# Patient Record
Sex: Female | Born: 1980 | Race: White | Hispanic: No | Marital: Single | Smoking: Never smoker
Health system: Southern US, Community
[De-identification: ages and names within clinical notes are randomized; demographics above are authoritative.]

## PROBLEM LIST (undated history)

## (undated) DIAGNOSIS — J45909 Unspecified asthma, uncomplicated: Secondary | ICD-10-CM

## (undated) DIAGNOSIS — N83209 Unspecified ovarian cyst, unspecified side: Secondary | ICD-10-CM

## (undated) DIAGNOSIS — F419 Anxiety disorder, unspecified: Secondary | ICD-10-CM

## (undated) HISTORY — PX: BREAST LUMPECTOMY: SHX2

## (undated) HISTORY — DX: Anxiety disorder, unspecified: F41.9

## (undated) HISTORY — DX: Unspecified asthma, uncomplicated: J45.909

## (undated) HISTORY — DX: Unspecified ovarian cyst, unspecified side: N83.209

---

## 2020-10-19 DIAGNOSIS — F411 Generalized anxiety disorder: Secondary | ICD-10-CM | POA: Insufficient documentation

## 2020-10-19 DIAGNOSIS — F418 Other specified anxiety disorders: Secondary | ICD-10-CM | POA: Insufficient documentation

## 2020-12-21 ENCOUNTER — Encounter: Payer: Self-pay | Admitting: Internal Medicine

## 2020-12-21 ENCOUNTER — Ambulatory Visit: Payer: 59 | Admitting: Internal Medicine

## 2020-12-21 ENCOUNTER — Other Ambulatory Visit: Payer: Self-pay

## 2020-12-21 VITALS — BP 106/72 | HR 104 | Temp 98.0°F | Ht 63.0 in | Wt 156.0 lb

## 2020-12-21 DIAGNOSIS — F411 Generalized anxiety disorder: Secondary | ICD-10-CM

## 2020-12-21 DIAGNOSIS — G43009 Migraine without aura, not intractable, without status migrainosus: Secondary | ICD-10-CM | POA: Diagnosis not present

## 2020-12-21 DIAGNOSIS — R1011 Right upper quadrant pain: Secondary | ICD-10-CM | POA: Diagnosis not present

## 2020-12-21 DIAGNOSIS — S46912A Strain of unspecified muscle, fascia and tendon at shoulder and upper arm level, left arm, initial encounter: Secondary | ICD-10-CM | POA: Diagnosis not present

## 2020-12-21 DIAGNOSIS — S56912A Strain of unspecified muscles, fascia and tendons at forearm level, left arm, initial encounter: Secondary | ICD-10-CM

## 2020-12-21 NOTE — Progress Notes (Signed)
Date:  12/21/2020   Name:  Emily Gordon   DOB:  1981-07-16   MRN:  [id number]   Chief Complaint: Establish Care, Arm Pain (X1 weeks, Left arm, pt fell, did not make contact with the ground, broke her fall with arms), and Abdominal Pain (X1 year, Right side, pain comes and goes, pt not in pain today )  Arm Pain  The incident occurred 5 to 7 days ago. The incident occurred at home. The injury mechanism was a fall. The pain is mild.  Abdominal Pain This is a recurrent problem. The current episode started more than 1 year ago. The problem occurs intermittently (several times per month). The most recent episode lasted 2 days. The problem has been unchanged. The pain is located in the RUQ and generalized abdominal region. The abdominal pain radiates to the RUQ and back. Associated symptoms include arthralgias (left elbow). Pertinent negatives include no constipation, diarrhea, fever, flatus, headaches (no migraine in at least 3 yrs), nausea or vomiting. She has tried nothing for the symptoms. Prior workup: hx of gastroparesis dx'd 5 yrs ago in [place].    No results found for: CREATININE, BUN, NA, K, CL, CO2 No results found for: CHOL, HDL, LDLCALC, LDLDIRECT, TRIG, CHOLHDL No results found for: TSH No results found for: HGBA1C No results found for: WBC, HGB, HCT, MCV, PLT No results found for: ALT, AST, GGT, ALKPHOS, BILITOT   Review of Systems  Constitutional: Positive for fatigue. Negative for chills and fever.  Respiratory: Negative for cough, chest tightness, shortness of breath and wheezing.   Gastrointestinal: Positive for abdominal pain. Negative for blood in stool, constipation, diarrhea, flatus, nausea and vomiting.  Genitourinary: Negative for menstrual problem.  Musculoskeletal: Positive for arthralgias (left elbow).  Neurological: Negative for dizziness and headaches (no migraine in at least 3 yrs).  Psychiatric/Behavioral: Positive for sleep disturbance. Negative for dysphoric  mood. The patient is not nervous/anxious.     Patient Active Problem List   Diagnosis Date Noted  . Generalized anxiety disorder 10/19/2020    Not on File  Past Surgical History:  Procedure Laterality Date  . BREAST LUMPECTOMY Right     Social History   Tobacco Use  . Smoking status: Never Smoker  . Smokeless tobacco: Never Used  Vaping Use  . Vaping Use: Never used  Substance Use Topics  . Alcohol use: Yes    Comment: occassionally  . Drug use: Never     Medication list has been reviewed and updated.  No outpatient medications have been marked as taking for the 12/21/20 encounter (Office Visit) with Reubin Milan, MD.    [facility] 2/9 Scores 12/21/2020  PHQ - 2 Score 0  PHQ- 9 Score 14    GAD 7 : Generalized Anxiety Score 12/21/2020  Nervous, Anxious, on Edge 1  Control/stop worrying 0  Worry too much - different things 0  Trouble relaxing 0  Restless 2  Easily annoyed or irritable 2  Afraid - awful might happen 0  Total GAD 7 Score 5    BP Readings from Last 3 Encounters:  12/21/20 106/72    Physical Exam Vitals and nursing note reviewed.  Constitutional:      General: She is not in acute distress.    Appearance: She is well-developed.  HENT:     Head: Normocephalic and atraumatic.  Neck:     Vascular: No carotid bruit.  Cardiovascular:     Rate and Rhythm: Normal rate and regular rhythm.  Pulses: Normal pulses.     Heart sounds: No murmur heard.   Pulmonary:     Effort: Pulmonary effort is normal. No respiratory distress.     Breath sounds: No wheezing or rhonchi.  Abdominal:     General: Abdomen is flat. Bowel sounds are normal.     Palpations: Abdomen is soft.     Tenderness: There is no abdominal tenderness. There is no right CVA tenderness, left CVA tenderness or guarding. Negative signs include Murphy's sign.  Musculoskeletal:     Left elbow: No swelling, deformity or effusion. Decreased range of motion. Tenderness present.      Cervical back: Normal range of motion.  Lymphadenopathy:     Cervical: No cervical adenopathy.  Skin:    General: Skin is warm and dry.     Findings: No rash.  Neurological:     Mental Status: She is alert and oriented to person, place, and time.  Psychiatric:        Mood and Affect: Mood normal.        Behavior: Behavior normal.     Wt Readings from Last 3 Encounters:  12/21/20 156 lb (70.8 kg)    BP 106/72   Pulse (!) 104   Temp 98 F (36.7 C) (Oral)   Ht 5\' 3"  (1.6 m)   Wt 156 lb (70.8 kg)   LMP 12/07/2020   SpO2 97%   BMI 27.63 kg/m   Assessment and Plan: 1. RUQ abdominal pain Concern for intermittent gall bladder associated sx Will get labs and 02/06/2021 - CBC with Differential/Platelet - Comprehensive metabolic panel - US Abdomen Complete; Future  2. Generalized anxiety disorder Pt feels that she is doing well currently Has tried Paxil in the recent past but did not like side effects - does not want medication at this time - TSH  3. Migraine without aura and without status migrainosus, not intractable None recently  4. Elbow strain, left, initial encounter Continue conservative therapy; avoid heavy lifting Advil or tylenol as needed  Schedule CPX with Pap. Partially dictated using [place]. Any errors are unintentional.  Animal nutritionist, MD [facility]  12/21/2020

## 2020-12-22 ENCOUNTER — Ambulatory Visit
Admission: EM | Admit: 2020-12-22 | Discharge: 2020-12-22 | Disposition: A | Discharge status: Home / Self Care | Payer: 59

## 2020-12-22 DIAGNOSIS — S99922A Unspecified injury of left foot, initial encounter: Secondary | ICD-10-CM

## 2020-12-22 LAB — CBC WITH DIFFERENTIAL/PLATELET
Basophils Absolute: 0.1 10*3/uL (ref 0.0–0.2)
Basos: 1 %
EOS (ABSOLUTE): 0 10*3/uL (ref 0.0–0.4)
Eos: 0 %
Hematocrit: 41 % (ref 34.0–46.6)
Hemoglobin: 13.8 g/dL (ref 11.1–15.9)
Immature Grans (Abs): 0 10*3/uL (ref 0.0–0.1)
Immature Granulocytes: 0 %
Lymphocytes Absolute: 1.9 10*3/uL (ref 0.7–3.1)
Lymphs: 25 %
MCH: 29.7 pg (ref 26.6–33.0)
MCHC: 33.7 g/dL (ref 31.5–35.7)
MCV: 88 fL (ref 79–97)
Monocytes Absolute: 0.6 10*3/uL (ref 0.1–0.9)
Monocytes: 7 %
Neutrophils Absolute: 5.1 10*3/uL (ref 1.4–7.0)
Neutrophils: 67 %
Platelets: 362 10*3/uL (ref 150–450)
RBC: 4.65 x10E6/uL (ref 3.77–5.28)
RDW: 12.6 % (ref 11.7–15.4)
WBC: 7.8 10*3/uL (ref 3.4–10.8)

## 2020-12-22 LAB — COMPREHENSIVE METABOLIC PANEL
ALT: 25 IU/L (ref 0–32)
AST: 26 IU/L (ref 0–40)
Albumin/Globulin Ratio: 1.9 (ref 1.2–2.2)
Albumin: 4.7 g/dL (ref 3.8–4.8)
Alkaline Phosphatase: 98 IU/L (ref 44–121)
BUN/Creatinine Ratio: 13 (ref 9–23)
BUN: 12 mg/dL (ref 6–20)
Bilirubin Total: 0.2 mg/dL (ref 0.0–1.2)
CO2: 22 mmol/L (ref 20–29)
Calcium: 9.9 mg/dL (ref 8.7–10.2)
Chloride: 99 mmol/L (ref 96–106)
Creatinine, Ser: 0.94 mg/dL (ref 0.57–1.00)
Globulin, Total: 2.5 g/dL (ref 1.5–4.5)
Glucose: 107 mg/dL — ABNORMAL HIGH (ref 65–99)
Potassium: 4.2 mmol/L (ref 3.5–5.2)
Sodium: 138 mmol/L (ref 134–144)
Total Protein: 7.2 g/dL (ref 6.0–8.5)
eGFR: 79 mL/min/{1.73_m2} (ref >59)

## 2020-12-22 LAB — TSH: TSH: 1.74 u[IU]/mL (ref 0.450–4.500)

## 2020-12-22 NOTE — Discharge Instructions (Addendum)
Keep the area covered with a Band-Aid as the new nail grows in.  Clip the sharp edge with the peritoneal clippers when at home so it does not catch on any more clothing or shoes.  Return for reevaluation for any new or worsening injury.

## 2020-12-22 NOTE — ED Triage Notes (Signed)
Pt c/o toenail problem. She hit her toe with a free weight and it broke the tip of her left great toe nail 2 days ago. She reports today she rubbed it in some way and it started to lift off the broken part of the nail. She has applied ambusol to the nail bed in an attempt to remove the broken part of the nail. She was not successful. She is asking to have the toe numbed and the nail removed.

## 2020-12-22 NOTE — ED Provider Notes (Signed)
[facility]    CSN: [id number] Arrival date & time: 12/22/20  1914      History   Chief Complaint Chief Complaint  Patient presents with  . toe nail injury    Left great    HPI Emily Gordon is a 40 y.o. female.   HPI   40 year old female here for evaluation of injury to her left big toenail.  Patient reports that 2 days ago she kicked a free weight and broke the tip of her left great toenail.  Today she reports that the broken piece, which was not secured with Band-Aid, caught on the back of her pants and tore some more.  She attempted to numb her toe up using Anbesol and remove the nail herself but states that it was too painful for her to attempt manipulation.  Past Medical History:  Diagnosis Date  . Anxiety   . Asthma   . Ovarian cyst     Patient Active Problem List   Diagnosis Date Noted  . Generalized anxiety disorder 10/19/2020    Past Surgical History:  Procedure Laterality Date  . BREAST LUMPECTOMY Right     OB History   No obstetric history on file.      Home Medications    Prior to Admission medications   Not on File    Family History Family History  Problem Relation Age of Onset  . Cancer Maternal Grandmother   . Cancer Maternal Grandfather     Social History Social History   Tobacco Use  . Smoking status: Never Smoker  . Smokeless tobacco: Never Used  Vaping Use  . Vaping Use: Never used  Substance Use Topics  . Alcohol use: Yes    Comment: occassionally  . Drug use: Never     Allergies   Patient has no known allergies.   Review of Systems Review of Systems  Constitutional: Negative for activity change, appetite change and fever.  Skin: Positive for wound. Negative for color change.  Neurological: Negative for weakness and numbness.  Hematological: Negative.   Psychiatric/Behavioral: Negative.      Physical Exam Triage Vital Signs ED Triage Vitals  Enc Vitals Group     BP 12/22/20 1946 (!) 143/87      Pulse Rate 12/22/20 1946 (!) 106     Resp 12/22/20 1946 18     Temp 12/22/20 1946 98.4 F (36.9 C)     Temp Source 12/22/20 1946 Oral     SpO2 12/22/20 1946 99 %     Weight 12/22/20 1945 155 lb (70.3 kg)     Height 12/22/20 1945 5\' 3"  (1.6 m)     Head Circumference --      Peak Flow --      Pain Score 12/22/20 1944 10     Pain Loc --      Pain Edu? --      Excl. in GC? --    No data found.  Updated Vital Signs BP (!) 143/87 (BP Location: Left Arm)   Pulse (!) 106   Temp 98.4 F (36.9 C) (Oral)   Resp 18   Ht 5\' 3"  (1.6 m)   Wt 155 lb (70.3 kg)   LMP 12/07/2020   SpO2 99%   BMI 27.46 kg/m   Visual Acuity Right Eye Distance:   Left Eye Distance:   Bilateral Distance:    Right Eye Near:   Left Eye Near:    Bilateral Near:     Physical Exam  Vitals and nursing note reviewed.  Constitutional:      General: She is not in acute distress.    Appearance: Normal appearance. She is normal weight. She is not ill-appearing.  HENT:     Head: Normocephalic and atraumatic.  Musculoskeletal:        General: Tenderness and signs of injury present. No swelling or deformity. Normal range of motion.  Skin:    General: Skin is warm and dry.     Capillary Refill: Capillary refill takes less than 2 seconds.     Findings: No bruising, erythema or rash.  Neurological:     General: No focal deficit present.     Mental Status: She is alert and oriented to person, place, and time.  Psychiatric:        Mood and Affect: Mood normal.        Behavior: Behavior normal.        Thought Content: Thought content normal.        Judgment: Judgment normal.      UC Treatments / Results  Labs (all labs ordered are listed, but only abnormal results are displayed) Labs Reviewed - No data to display  EKG   Radiology No results found.  Procedures Procedures (including critical care time)  Medications Ordered in UC Medications - No data to display  Initial Impression / Assessment  and Plan / UC Course  I have reviewed the triage vital signs and the nursing notes.  Pertinent labs & imaging results that were available during my care of the patient were reviewed by me and considered in my medical decision making (see chart for details).   Patient is a very pleasant 40 year old female here for evaluation of a hangnail on her left great toenail.  She reports that she initially broke the nail by kicking a free weight yesterday and then got the broken piece hooked on her pant leg today which tore it but did not tear completely.  Patient tried numbing up the toenail herself by applying Anbesol to remove the piece of nail but was unsuccessful due to the pain.  Patient has no tenderness to the IP or MTP joint of the left great toe and there is no erythema, edema, or ecchymosis to the tip of her toe.  Patient initially requesting to be anesthetized for the removal of the torn piece of toenail but reconsidered after was explained that she would have to get a needle in her toe in order to achieve that.  Piece of toenail was easily removed manually with a piece of gauze.  No active bleeding from the site.  Patient does have a sharp edged remaining to the deeper toenail that I encouraged her to clip when she gets home with a pair of clippers.  Band-Aid applied and patient left by ambulation in no acute distress.   Final Clinical Impressions(s) / UC Diagnoses   Final diagnoses:  Injury of toenail of left foot, initial encounter     Discharge Instructions     Keep the area covered with a Band-Aid as the new nail grows in.  Clip the sharp edge with the peritoneal clippers when at home so it does not catch on any more clothing or shoes.  Return for reevaluation for any new or worsening injury.    ED Prescriptions    None     PDMP not reviewed this encounter.   Becky Augusta, NP 12/22/20 2018

## 2021-02-08 ENCOUNTER — Other Ambulatory Visit: Payer: Self-pay

## 2021-02-08 ENCOUNTER — Ambulatory Visit
Admission: RE | Admit: 2021-02-08 | Discharge: 2021-02-08 | Disposition: A | Discharge status: Home / Self Care | Payer: BC Managed Care – PPO | Source: Ambulatory Visit | Attending: Internal Medicine | Admitting: Internal Medicine

## 2021-02-08 DIAGNOSIS — R1011 Right upper quadrant pain: Secondary | ICD-10-CM | POA: Insufficient documentation

## 2021-03-22 ENCOUNTER — Emergency Department: Payer: BC Managed Care – PPO

## 2021-03-22 ENCOUNTER — Emergency Department
Admission: EM | Admit: 2021-03-22 | Discharge: 2021-03-22 | Disposition: A | Discharge status: Home / Self Care | Payer: BC Managed Care – PPO | Attending: Emergency Medicine | Admitting: Emergency Medicine

## 2021-03-22 ENCOUNTER — Other Ambulatory Visit: Payer: Self-pay

## 2021-03-22 DIAGNOSIS — N939 Abnormal uterine and vaginal bleeding, unspecified: Secondary | ICD-10-CM | POA: Diagnosis present

## 2021-03-22 DIAGNOSIS — J45909 Unspecified asthma, uncomplicated: Secondary | ICD-10-CM | POA: Diagnosis not present

## 2021-03-22 DIAGNOSIS — R102 Pelvic and perineal pain: Secondary | ICD-10-CM | POA: Insufficient documentation

## 2021-03-22 LAB — URINALYSIS, COMPLETE (UACMP) WITH MICROSCOPIC
Bacteria, UA: NONE SEEN
Bilirubin Urine: NEGATIVE
Glucose, UA: NEGATIVE mg/dL
Ketones, ur: NEGATIVE mg/dL
Leukocytes,Ua: NEGATIVE
Nitrite: NEGATIVE
Protein, ur: NEGATIVE mg/dL
Specific Gravity, Urine: 1.005 (ref 1.005–1.030)
WBC, UA: NONE SEEN WBC/hpf (ref 0–5)
pH: 7 (ref 5.0–8.0)

## 2021-03-22 LAB — CBC
HCT: 40 % (ref 36.0–46.0)
Hemoglobin: 13.7 g/dL (ref 12.0–15.0)
MCH: 30 pg (ref 26.0–34.0)
MCHC: 34.3 g/dL (ref 30.0–36.0)
MCV: 87.5 fL (ref 80.0–100.0)
Platelets: 345 10*3/uL (ref 150–400)
RBC: 4.57 MIL/uL (ref 3.87–5.11)
RDW: 13.1 % (ref 11.5–15.5)
WBC: 10.6 10*3/uL — ABNORMAL HIGH (ref 4.0–10.5)
nRBC: 0 % (ref 0.0–0.2)

## 2021-03-22 LAB — CHLAMYDIA/NGC RT PCR (ARMC ONLY)
Chlamydia Tr: NOT DETECTED
N gonorrhoeae: NOT DETECTED

## 2021-03-22 LAB — WET PREP, GENITAL
Clue Cells Wet Prep HPF POC: NONE SEEN
Sperm: NONE SEEN
Trich, Wet Prep: NONE SEEN
Yeast Wet Prep HPF POC: NONE SEEN

## 2021-03-22 LAB — POC URINE PREG, ED: Preg Test, Ur: NEGATIVE

## 2021-03-22 NOTE — ED Provider Notes (Signed)
[facility] Emergency Department Provider Note ____________________________________________  Time seen: 1648  I have reviewed the triage vital signs and the nursing notes.  HISTORY  Chief Complaint  Vaginal Bleeding   HPI Emily Gordon is a 40 y.o. female presents to the ED for intermittent vaginal bleeding, after sexual encounter. She reports pelvic fullness and dark to bright red blood. She notes flow less than a menstrual period. She denies vaginal discharge, dysuria, or flank pain. She notes regular menses every month.   Past Medical History:  Diagnosis Date   Anxiety    Asthma    Ovarian cyst     Patient Active Problem List   Diagnosis Date Noted   Generalized anxiety disorder 10/19/2020    Past Surgical History:  Procedure Laterality Date   BREAST LUMPECTOMY Right     Prior to Admission medications   Not on File    Allergies Patient has no known allergies.  Family History  Problem Relation Age of Onset   Cancer Maternal Grandmother    Cancer Maternal Grandfather     Social History Social History   Tobacco Use   Smoking status: Never   Smokeless tobacco: Never  Vaping Use   Vaping Use: Never used  Substance Use Topics   Alcohol use: Yes    Comment: occassionally   Drug use: Never    Review of Systems  Constitutional: Negative for fever. Eyes: Negative for visual changes. ENT: Negative for sore throat. Cardiovascular: Negative for chest pain. Respiratory: Negative for shortness of breath. Gastrointestinal: Negative for abdominal pain, vomiting and diarrhea. Genitourinary: Negative for dysuria.abnormal vaginal bleeding. Pelvic pain Musculoskeletal: Negative for back pain. Skin: Negative for rash. Neurological: Negative for headaches, focal weakness or numbness. ____________________________________________  PHYSICAL EXAM:  VITAL SIGNS: ED Triage Vitals  Enc Vitals Group     BP 03/22/21 1547 135/66     Pulse Rate  03/22/21 1547 100     Resp 03/22/21 1547 18     Temp 03/22/21 1547 98 F (36.7 C)     Temp src --      SpO2 03/22/21 1547 100 %     Weight --      Height --      Head Circumference --      Peak Flow --      Pain Score 03/22/21 1544 6     Pain Loc --      Pain Edu? --      Excl. in GC? --     Constitutional: Alert and oriented. Well appearing and in no distress. Head: Normocephalic and atraumatic. Eyes: Conjunctivae are normal. Normal extraocular movements Cardiovascular: Normal rate, regular rhythm. Normal distal pulses. Respiratory: Normal respiratory effort.  Gastrointestinal: Soft and nontender. No distention. GU: Normal external genitalia.  Bright red blood in the vaginal vault.  Cervical os is closed with no CMT noted no adnexal masses appreciated. Musculoskeletal: Nontender with normal range of motion in all extremities.  Neurologic:  Normal gait without ataxia. Normal speech and language. No gross focal neurologic deficits are appreciated. Skin:  Skin is warm, dry and intact. No rash noted. Psychiatric: Mood and affect are normal. Patient exhibits appropriate insight and judgment. ____________________________________________   LABS (pertinent positives/negatives)  Labs Reviewed  WET PREP, GENITAL - Abnormal; Notable for the following components:      Result Value   WBC, Wet Prep HPF POC FEW (*)    All other components within normal limits  CBC - Abnormal; Notable for the  following components:   WBC 10.6 (*)    All other components within normal limits  URINALYSIS, COMPLETE (UACMP) WITH MICROSCOPIC - Abnormal; Notable for the following components:   Color, Urine STRAW (*)    APPearance CLEAR (*)    Hgb urine dipstick MODERATE (*)    All other components within normal limits  CHLAMYDIA/NGC RT PCR ([facility] ONLY)            FSH/LH  POC URINE PREG, ED  ____________________________________________   RADIOLOGY  Official radiology report(s): US PELVIC COMPLETE W  TRANSVAGINAL AND TORSION R/O  Result Date: 03/22/2021 CLINICAL DATA:  Post coital vaginal bleeding EXAM: TRANSABDOMINAL AND TRANSVAGINAL ULTRASOUND OF PELVIS DOPPLER ULTRASOUND OF OVARIES TECHNIQUE: Both transabdominal and transvaginal ultrasound examinations of the pelvis were performed. Transabdominal technique was performed for global imaging of the pelvis including uterus, ovaries, adnexal regions, and pelvic cul-de-sac. It was necessary to proceed with endovaginal exam following the transabdominal exam to visualize the endometrium and adnexal structures. Color and duplex Doppler ultrasound was utilized to evaluate blood flow to the ovaries. COMPARISON:  None FINDINGS: Uterus Measurements: 7.3 x 3.1 x 4.3 cm = volume: 50.4 mL. No fibroids or other mass visualized. Endometrium Thickness: 5 mm.  No focal abnormality visualized. Right ovary Measurements: 2.0 x 2.9 x 1.6 cm = volume: 4.5 mL. Normal appearance/no adnexal mass. Left ovary Measurements: 2.5 x 1.1 x 2.1 cm = volume: 3.1 mL. Normal appearance/no adnexal mass. Pulsed Doppler evaluation of both ovaries demonstrates normal low-resistance arterial and venous waveforms. Other findings No abnormal free fluid. IMPRESSION: 1. Unremarkable age-appropriate pelvic ultrasound. Electronically Signed   By: Sharlet Salina M.D.   On: 03/22/2021 19:12   ____________________________________________  PROCEDURES   Procedures ____________________________________________   INITIAL IMPRESSION / ASSESSMENT AND PLAN / ED COURSE  As part of my medical decision making, I reviewed the following data within the electronic MEDICAL RECORD NUMBER Labs reviewed as above, Radiograph reviewed WNL, and Notes from prior ED visits    Differential diagnosis includes, but is not limited to, threatened miscarriage, incomplete miscarriage, normal bleeding from menses, menorrhagia/metrorrhagia, ectopic pregnancy, benign bleeding of pregnancy, vaginal/cervical trauma, ovarian cyst,  endometriosis, etc.  Female with with ED evaluation of postcoital bleeding, for the last week.  Patient presents for evaluation of complaints.  Labs are reassuring as it shows no anemia or acute infection.  Vaginal swabs also negative for any acute infectious process.  Ultrasound does not reveal any ovarian cysts, endometriosis, uterine fibroids.  Patient exam is overall benign and stable at this time patient will follow with primary provider for ongoing management.  We discussed options for management of vaginal bleeding including NSAIDs, oral contraceptives, and progesterone.  Patient is declined any medical history at this time.  She will follow-up as discussed, return to the ED if necessary.    Emily Gordon was evaluated in Emergency Department on 03/22/2021 for the symptoms described in the history of present illness. She was evaluated in the context of the global COVID-19 pandemic, which necessitated consideration that the patient might be at risk for infection with the SARS-CoV-2 virus that causes COVID-19. Institutional protocols and algorithms that pertain to the evaluation of patients at risk for COVID-19 are in a state of rapid change based on information released by regulatory bodies including the CDC and federal and state organizations. These policies and algorithms were followed during the patient's care in the ED. ____________________________________________  FINAL CLINICAL IMPRESSION(S) / ED DIAGNOSES  Final diagnoses:  Abnormal vaginal bleeding  Pelvic pain      Karmen Stabs, Charlesetta Ivory, PA-C 03/22/21 1946    Arnaldo Natal, MD 03/23/21 [id number]

## 2021-03-22 NOTE — ED Notes (Signed)
See triage note, pt reports vaginal bleeding and lower abd pain that started after intercourse about a week ago. States bleeding had been intermittent but started more after wiping yesterday.  Hx ovarian cysts but states this feels different.  Ambulatory, NAD noted

## 2021-03-22 NOTE — ED Triage Notes (Addendum)
Pt comes with c/o week long vaginal bleeding. Pt states this started after intercourse and let up. Pt states it now started again.  Pt states unsure if pregnant and fullness to belly area.

## 2021-03-22 NOTE — Discharge Instructions (Addendum)
Your exam, labs, and ultrasound are reassuring at this time.  No signs of infection, UTI, or ovarian cyst. Consider taking ibuprofen for pain and bleeding relief.  You should follow-up with your primary provider for ongoing symptoms.  Return to the ED if needed.

## 2021-03-25 ENCOUNTER — Encounter: Payer: Self-pay | Admitting: Internal Medicine

## 2021-03-25 ENCOUNTER — Ambulatory Visit
Admission: RE | Admit: 2021-03-25 | Discharge: 2021-03-25 | Disposition: A | Discharge status: Home / Self Care | Payer: BC Managed Care – PPO | Source: Ambulatory Visit | Attending: Internal Medicine | Admitting: Internal Medicine

## 2021-03-25 ENCOUNTER — Other Ambulatory Visit: Payer: Self-pay

## 2021-03-25 ENCOUNTER — Ambulatory Visit
Admission: RE | Admit: 2021-03-25 | Discharge: 2021-03-25 | Disposition: A | Discharge status: Home / Self Care | Payer: BC Managed Care – PPO | Attending: Internal Medicine | Admitting: Internal Medicine

## 2021-03-25 ENCOUNTER — Ambulatory Visit (INDEPENDENT_AMBULATORY_CARE_PROVIDER_SITE_OTHER): Payer: BC Managed Care – PPO | Admitting: Internal Medicine

## 2021-03-25 VITALS — BP 128/74 | HR 78 | Temp 98.4°F | Ht 63.0 in | Wt 154.0 lb

## 2021-03-25 DIAGNOSIS — M25522 Pain in left elbow: Secondary | ICD-10-CM

## 2021-03-25 DIAGNOSIS — G8929 Other chronic pain: Secondary | ICD-10-CM | POA: Insufficient documentation

## 2021-03-25 DIAGNOSIS — N938 Other specified abnormal uterine and vaginal bleeding: Secondary | ICD-10-CM

## 2021-03-25 NOTE — Progress Notes (Signed)
Date:  03/25/2021   Name:  Emily Gordon   DOB:  08-Feb-1981   MRN:  [id number]   Chief Complaint: Arm Pain (X3.5 months, Left arm. Lump on arm, hurts when picking dogs up  ) and Vaginal Bleeding (Pain and bleeding after intercourse )  Arm Pain  The incident occurred more than 1 week ago. The incident occurred at home. The injury mechanism was a fall. The pain is present in the left elbow. The quality of the pain is described as aching. The pain does not radiate. The pain is mild. The pain has been Fluctuating since the incident. Pertinent negatives include no chest pain or numbness. Exacerbated by: supination and lifting. She has tried rest for the symptoms. The treatment provided no relief.  Vaginal Bleeding This is a new problem. The problem has been gradually improving. Associated symptoms include abdominal pain (mild cramping). Pertinent negatives include no chills or fever.  She has onset of bleeding after intercourse that was moderate but then decreased, followed by cramping and heavier flow that was early for her cycle.  However, cycle is irregular.  She was seen in ED - CBC, pregnancy test, STD screens were negative.  She is still having some bleeding but it is lessening as it would for a regular cycle. US - unremarkable  Lab Results  Component Value Date   CREATININE 0.94 12/21/2020   BUN 12 12/21/2020   NA 138 12/21/2020   K 4.2 12/21/2020   CL 99 12/21/2020   CO2 22 12/21/2020   No results found for: CHOL, HDL, LDLCALC, LDLDIRECT, TRIG, CHOLHDL Lab Results  Component Value Date   TSH 1.740 12/21/2020   No results found for: HGBA1C Lab Results  Component Value Date   WBC 10.6 (H) 03/22/2021   HGB 13.7 03/22/2021   HCT 40.0 03/22/2021   MCV 87.5 03/22/2021   PLT 345 03/22/2021   Lab Results  Component Value Date   ALT 25 12/21/2020   AST 26 12/21/2020   ALKPHOS 98 12/21/2020   BILITOT 0.2 12/21/2020     Review of Systems  Constitutional:  Negative for  chills, fatigue and fever.  Respiratory:  Negative for chest tightness and shortness of breath.   Cardiovascular:  Negative for chest pain.  Gastrointestinal:  Positive for abdominal pain (mild cramping).  Genitourinary:  Positive for vaginal bleeding.  Musculoskeletal:  Positive for arthralgias (left elbow pain and fullness in the antecubital area).  Neurological:  Negative for weakness and numbness.   Patient Active Problem List   Diagnosis Date Noted   Generalized anxiety disorder 10/19/2020    No Known Allergies  Past Surgical History:  Procedure Laterality Date   BREAST LUMPECTOMY Right     Social History   Tobacco Use   Smoking status: Never   Smokeless tobacco: Never  Vaping Use   Vaping Use: Never used  Substance Use Topics   Alcohol use: Yes    Comment: occassionally   Drug use: Never     Medication list has been reviewed and updated.  Current Meds  Medication Sig   ibuprofen (ADVIL) 600 MG tablet Take 600 mg by mouth every 6 (six) hours as needed.    PHQ 2/9 Scores 03/25/2021 12/21/2020  PHQ - 2 Score 3 0  PHQ- 9 Score 17 14    GAD 7 : Generalized Anxiety Score 03/25/2021 12/21/2020  Nervous, Anxious, on Edge 1 1  Control/stop worrying 1 0  Worry too much - different things 1 0  Trouble relaxing 1 0  Restless 2 2  Easily annoyed or irritable 2 2  Afraid - awful might happen 0 0  Total GAD 7 Score 8 5    BP Readings from Last 3 Encounters:  03/25/21 128/74  03/22/21 123/70  12/22/20 (!) 143/87    Physical Exam Vitals and nursing note reviewed.  Constitutional:      General: She is not in acute distress.    Appearance: Normal appearance. She is well-developed.  HENT:     Head: Normocephalic and atraumatic.  Cardiovascular:     Rate and Rhythm: Normal rate and regular rhythm.     Pulses: Normal pulses.     Heart sounds: No murmur heard. Pulmonary:     Effort: Pulmonary effort is normal. No respiratory distress.     Breath sounds: No  wheezing or rhonchi.  Musculoskeletal:     Left elbow: Swelling present. Decreased range of motion. Tenderness present.     Right lower leg: No edema.     Left lower leg: No edema.  Skin:    General: Skin is warm and dry.     Findings: No rash.  Neurological:     Mental Status: She is alert and oriented to person, place, and time.  Psychiatric:        Mood and Affect: Mood normal.        Behavior: Behavior normal.    Wt Readings from Last 3 Encounters:  03/25/21 154 lb (69.9 kg)  12/22/20 155 lb (70.3 kg)  12/21/20 156 lb (70.8 kg)    BP 128/74   Pulse 78   Temp 98.4 F (36.9 C) (Oral)   Ht 5\' 3"  (1.6 m)   Wt 154 lb (69.9 kg)   LMP 03/23/2021   SpO2 98%   BMI 27.28 kg/m   Assessment and Plan: 1. Chronic elbow pain, left Following a fall; not improving with activity modification Will get plain films and refer to Dr. 03/25/2021 - DG Elbow Complete Left; Future  2. Dysfunctional uterine bleeding Likely due to irregular menses Labs and Ashley Royalty were reassuring Rec establish care and follow up with GYN - Ambulatory referral to Obstetrics / Gynecology   Partially dictated using Dragon software. Any errors are unintentional.  [place], MD [facility]  03/25/2021

## 2021-04-05 ENCOUNTER — Ambulatory Visit (INDEPENDENT_AMBULATORY_CARE_PROVIDER_SITE_OTHER): Payer: BC Managed Care – PPO | Admitting: Family Medicine

## 2021-04-05 ENCOUNTER — Other Ambulatory Visit: Payer: Self-pay

## 2021-04-05 ENCOUNTER — Encounter: Payer: Self-pay | Admitting: Family Medicine

## 2021-04-05 VITALS — BP 116/72 | HR 114 | Temp 98.5°F | Ht 63.0 in | Wt 152.0 lb

## 2021-04-05 DIAGNOSIS — M7712 Lateral epicondylitis, left elbow: Secondary | ICD-10-CM | POA: Diagnosis not present

## 2021-04-05 MED ORDER — MELOXICAM 15 MG PO TABS: 15.0000 mg | ORAL_TABLET | Freq: Every day | ORAL | 0 refills | Status: DC

## 2021-04-05 NOTE — Progress Notes (Signed)
Primary Care / Sports Medicine Office Visit  Patient Information:  Patient ID: Emily Gordon, female DOB: December 20, 1980 Age: 40 y.o. MRN: [id number]   Emily Gordon is a pleasant 40 y.o. female presenting with the following:  Chief Complaint  Patient presents with   New Patient (Initial Visit)   Elbow Pain    Left; x3 months; no known injury, but history of falling 3 times this past year; X-Ray 03/25/21; located near lateral antecubital space; has tried ibuprofen and heat with no relief; intermittent; right-handedness; 6/10 pain    Review of Systems pertinent details above   Patient Active Problem List   Diagnosis Date Noted   Lateral epicondylitis of left elbow 04/05/2021   Generalized anxiety disorder 10/19/2020   Past Medical History:  Diagnosis Date   Anxiety    Asthma    Ovarian cyst    Outpatient Encounter Medications as of 04/05/2021  Medication Sig   meloxicam (MOBIC) 15 MG tablet Take 1 tablet (15 mg total) by mouth daily.   [DISCONTINUED] ibuprofen (ADVIL) 600 MG tablet Take 600 mg by mouth every 6 (six) hours as needed.   No facility-administered encounter medications on file as of 04/05/2021.   Past Surgical History:  Procedure Laterality Date   BREAST LUMPECTOMY Right     Vitals:   04/05/21 1552  BP: 116/72  Pulse: (!) 114  Temp: 98.5 F (36.9 C)  SpO2: 98%   Vitals:   04/05/21 1552  Weight: 152 lb (68.9 kg)  Height: 5\' 3"  (1.6 m)   Body mass index is 26.93 kg/m.  DG Elbow Complete Left  Result Date: 03/26/2021 CLINICAL DATA:  Chronic left elbow pain EXAM: LEFT ELBOW - COMPLETE 3+ VIEW COMPARISON:  None. FINDINGS: There is no evidence of fracture, dislocation, or joint effusion. There is no evidence of arthropathy or other focal bone abnormality. Soft tissues are unremarkable. IMPRESSION: No acute osseous injury of the left elbow. Electronically Signed   By: 03/28/2021   On: 03/26/2021 10:54   Independent interpretation of notes and tests  performed by another provider:   Independent interpretation of left elbow x-ray dated 03/25/2021 reveals maintained articular surfaces, no cortical irregularities or roughening, no abnormal soft tissue shadow appreciated, no acute osseous process identified  Procedures performed:   None  Pertinent History, Exam, Impression, and Recommendations:   Lateral epicondylitis of left elbow Right-hand-dominant patient presenting with atraumatic left lateral elbow pain, she does endorse an 8-hour drive preceding the onset of pain, pain is focal, nonradiating, aggravated by lifting and bending the elbow, hand/wrist usage ipsilaterally.  Denies any paresthesias, minimally improved with rest and OTC NSAIDs.  Physical exam today shows focal tenderness to the extensor carpi radialis complex, provocative testing is consistent with lateral epicondylitis. Her x-rays are reassuring. We did trial counterforce brace which was beneficial, she will use this x 4 weeks, start home rehab in 1 week (exercises provided), dose meloxicam daily x2 weeks then transition to as needed, and maintain follow-up in 4 weeks.  If suboptimal progress noted, anticipate ultrasound-guided injection, can also consider formal physical therapy, escalation of oral pharmacotherapy.  If improved, maintenance strategies to be discussed and weaning/discontinuation of counterforce brace.    Orders & Medications Meds ordered this encounter  Medications   meloxicam (MOBIC) 15 MG tablet    Sig: Take 1 tablet (15 mg total) by mouth daily.    Dispense:  30 tablet    Refill:  0   No orders of the defined  types were placed in this encounter.    Return in about 4 weeks (around 05/03/2021).     Jerrol Banana, MD   Primary Care Sports Medicine [facility]

## 2021-04-05 NOTE — Assessment & Plan Note (Signed)
Right-hand-dominant patient presenting with atraumatic left lateral elbow pain, she does endorse an 8-hour drive preceding the onset of pain, pain is focal, nonradiating, aggravated by lifting and bending the elbow, hand/wrist usage ipsilaterally.  Denies any paresthesias, minimally improved with rest and OTC NSAIDs.  Physical exam today shows focal tenderness to the extensor carpi radialis complex, provocative testing is consistent with lateral epicondylitis. Her x-rays are reassuring. We did trial counterforce brace which was beneficial, she will use this x 4 weeks, start home rehab in 1 week (exercises provided), dose meloxicam daily x2 weeks then transition to as needed, and maintain follow-up in 4 weeks.  If suboptimal progress noted, anticipate ultrasound-guided injection, can also consider formal physical therapy, escalation of oral pharmacotherapy.  If improved, maintenance strategies to be discussed and weaning/discontinuation of counterforce brace.

## 2021-04-05 NOTE — Patient Instructions (Signed)
-   Start meloxicam and dose once daily with food x2 weeks, after 2 weeks dose once daily on an as-needed basis - Use elbow strap brace throughout your wakeful hours (removed for bathing, sleeping, exercises) - After 1 week, start and gradually advance home exercises (start the stretching exercises and perform the strengthening exercises once daily stretching exercises for nearly painless) - Perform activity as tolerated using symptoms as a guide - Return for follow-up in 4 weeks

## 2021-04-07 ENCOUNTER — Ambulatory Visit: Payer: BC Managed Care – PPO | Admitting: Internal Medicine

## 2021-04-07 ENCOUNTER — Other Ambulatory Visit: Payer: Self-pay

## 2021-04-07 ENCOUNTER — Encounter: Payer: Self-pay | Admitting: Internal Medicine

## 2021-04-07 VITALS — BP 136/82 | HR 92 | Ht 63.0 in | Wt 152.0 lb

## 2021-04-07 DIAGNOSIS — F411 Generalized anxiety disorder: Secondary | ICD-10-CM | POA: Diagnosis not present

## 2021-04-07 DIAGNOSIS — M542 Cervicalgia: Secondary | ICD-10-CM | POA: Diagnosis not present

## 2021-04-07 DIAGNOSIS — R42 Dizziness and giddiness: Secondary | ICD-10-CM

## 2021-04-07 DIAGNOSIS — R000 Tachycardia, unspecified: Secondary | ICD-10-CM | POA: Diagnosis not present

## 2021-04-07 DIAGNOSIS — G8929 Other chronic pain: Secondary | ICD-10-CM

## 2021-04-07 DIAGNOSIS — R5383 Other fatigue: Secondary | ICD-10-CM

## 2021-04-07 MED ORDER — CYCLOBENZAPRINE HCL 10 MG PO TABS: 10.0000 mg | ORAL_TABLET | Freq: Three times a day (TID) | ORAL | 0 refills | Status: DC | PRN

## 2021-04-07 NOTE — Progress Notes (Signed)
Date:  04/07/2021   Name:  Emily Gordon   DOB:  15-Jun-1981   MRN:  [id number]   Chief Complaint: Gait Problem (Dizzy spells with nausea on and off. Having memory loss more frequently. Gait issues. Fatigue. Muscle spasms in upper arms and eyes. This all has been happening for several years on and off.)  Dizziness This is a new problem. The current episode started 1 to 4 weeks ago. Episode frequency: 2 episodes. Associated symptoms include fatigue, nausea and vertigo. Pertinent negatives include no arthralgias, chest pain, chills, coughing, diaphoresis, fever, headaches, sore throat, vomiting or weakness. Nothing aggravates the symptoms.  Tingling in hands/arms - started about 6 years ago.  Evaluated by Neurology - she had an MRI and NCS.  Told she had nerve damage.  Tried a number of meds which were not helpful.  Never had ESI, accupuncture, medical massage.  Not sure about muscle relaxants. Memory loss - described more as decreased concentration - has to ask her boyfriend what he said minutes before.  Forgot where she parked her car at the store.  No other specific examples. Anxiety - has high anxiety - tried several meds in the past that made her feel worse - Zoloft, Paxil, Celexa, Bupropion. Fatigue - much less energy than usual.  Previously up early, keeping her house clean, etc now she is letting things go.  She is functioning at work but has to keep notes as she works. Tachycardia - hx of HR up to 220.  Seen in the past by cardiology and worked up with Holter.  No cause was found and she was treated with Cardizem.  She stopped it after some time when sx improved.  Now having a bit more increase in HR but not as severe as before.  She limits caffeine to 2 cups coffee per day, no energy drinks, diet pills, etc.  Lab Results  Component Value Date   CREATININE 0.94 12/21/2020   BUN 12 12/21/2020   NA 138 12/21/2020   K 4.2 12/21/2020   CL 99 12/21/2020   CO2 22 12/21/2020   No results  found for: CHOL, HDL, LDLCALC, LDLDIRECT, TRIG, CHOLHDL Lab Results  Component Value Date   TSH 1.740 12/21/2020   No results found for: HGBA1C Lab Results  Component Value Date   WBC 10.6 (H) 03/22/2021   HGB 13.7 03/22/2021   HCT 40.0 03/22/2021   MCV 87.5 03/22/2021   PLT 345 03/22/2021   Lab Results  Component Value Date   ALT 25 12/21/2020   AST 26 12/21/2020   ALKPHOS 98 12/21/2020   BILITOT 0.2 12/21/2020     Review of Systems  Constitutional:  Positive for fatigue. Negative for chills, diaphoresis, fever and unexpected weight change.  HENT:  Positive for tinnitus. Negative for ear pain, postnasal drip and sore throat.   Respiratory:  Negative for cough, chest tightness, shortness of breath and wheezing.   Cardiovascular:  Positive for palpitations. Negative for chest pain and leg swelling.  Gastrointestinal:  Positive for nausea. Negative for vomiting.  Genitourinary:  Pelvic pain: hair thinning.  Musculoskeletal:  Negative for arthralgias.  Neurological:  Positive for dizziness and vertigo. Negative for syncope, weakness, light-headedness and headaches.  Psychiatric/Behavioral:  Positive for dysphoric mood. Negative for sleep disturbance. The patient is nervous/anxious.    Patient Active Problem List   Diagnosis Date Noted   Lateral epicondylitis of left elbow 04/05/2021   Generalized anxiety disorder 10/19/2020    No Known Allergies  Past Surgical History:  Procedure Laterality Date   BREAST LUMPECTOMY Right     Social History   Tobacco Use   Smoking status: Never   Smokeless tobacco: Never  Vaping Use   Vaping Use: Never used  Substance Use Topics   Alcohol use: Yes    Comment: occassionally   Drug use: Never     Medication list has been reviewed and updated.  Current Meds  Medication Sig   cyclobenzaprine (FLEXERIL) 10 MG tablet Take 1 tablet (10 mg total) by mouth 3 (three) times daily as needed for muscle spasms.   meloxicam (MOBIC) 15  MG tablet Take 1 tablet (15 mg total) by mouth daily.    PHQ 2/9 Scores 04/05/2021 03/25/2021 12/21/2020  PHQ - 2 Score 4 3 0  PHQ- 9 Score 18 17 14     GAD 7 : Generalized Anxiety Score 04/05/2021 03/25/2021 12/21/2020  Nervous, Anxious, on Edge 3 1 1   Control/stop worrying 3 1 0  Worry too much - different things 3 1 0  Trouble relaxing 3 1 0  Restless 3 2 2   Easily annoyed or irritable 2 2 2   Afraid - awful might happen 0 0 0  Total GAD 7 Score 17 8 5   Anxiety Difficulty Very difficult - -    BP Readings from Last 3 Encounters:  04/07/21 136/82  04/05/21 116/72  03/25/21 128/74    Physical Exam Constitutional:      Appearance: Normal appearance.  HENT:     Right Ear: Tympanic membrane and ear canal normal.     Left Ear: Tympanic membrane and ear canal normal.  Cardiovascular:     Rate and Rhythm: Normal rate and regular rhythm.     Pulses: Normal pulses.     Heart sounds: No murmur heard. Pulmonary:     Effort: Pulmonary effort is normal.     Breath sounds: Normal breath sounds. No wheezing or rhonchi.  Abdominal:     General: Abdomen is flat.     Palpations: Abdomen is soft.  Musculoskeletal:     Cervical back: Spasms and tenderness present. Decreased range of motion.     Right lower leg: No edema.     Left lower leg: No edema.  Lymphadenopathy:     Cervical: No cervical adenopathy.  Neurological:     Mental Status: She is alert.     Cranial Nerves: Cranial nerves are intact.     Sensory: Sensation is intact.     Motor: Motor function is intact. No weakness, tremor, atrophy or abnormal muscle tone.     Coordination: Coordination is intact.     Deep Tendon Reflexes:     Reflex Scores:      Bicep reflexes are 2+ on the right side and 2+ on the left side.      Patellar reflexes are 2+ on the right side and 2+ on the left side. Psychiatric:        Attention and Perception: Attention normal.        Mood and Affect: Affect is tearful.        Speech: Speech normal.     Wt Readings from Last 3 Encounters:  04/07/21 152 lb (68.9 kg)  04/05/21 152 lb (68.9 kg)  03/25/21 154 lb (69.9 kg)    BP 136/82   Pulse 92   Ht 5\' 3"  (1.6 m)   Wt 152 lb (68.9 kg)   LMP 03/23/2021   SpO2 98%   BMI 26.93 kg/m  Assessment and Plan: 1. Vertigo 2 self limited episodes with no residual sx Hopefully will not recur.  2. Tachycardia HR below 100 after resting BP is normal. Will continue to monitor and consider treatment if persistent  3. Neck pain, chronic Will try to get records from Neurology - cyclobenzaprine (FLEXERIL) 10 MG tablet; Take 1 tablet (10 mg total) by mouth 3 (three) times daily as needed for muscle spasms.  Dispense: 30 tablet; Refill: 0  4. Generalized anxiety disorder Likely contributing to some of her symptoms Samples of Trintellix given - start with 5 mg daily x 2 weeks then increase to 10 mg.  5. Other fatigue Rule out B12 def and low vitamin D. - Vitamin B12 - VITAMIN D 25 Hydroxy (Vit-D Deficiency, Fractures)   Partially dictated using Animal nutritionist. Any errors are unintentional.  Bari Edward, MD [facility]  04/07/2021

## 2021-04-08 LAB — VITAMIN B12: Vitamin B-12: 372 pg/mL (ref 232–1245)

## 2021-04-08 LAB — VITAMIN D 25 HYDROXY (VIT D DEFICIENCY, FRACTURES): Vit D, 25-Hydroxy: 28.8 ng/mL — ABNORMAL LOW (ref 30.0–100.0)

## 2021-04-22 ENCOUNTER — Encounter: Payer: Self-pay | Admitting: Obstetrics and Gynecology

## 2021-04-22 ENCOUNTER — Other Ambulatory Visit: Payer: Self-pay

## 2021-04-22 ENCOUNTER — Ambulatory Visit (INDEPENDENT_AMBULATORY_CARE_PROVIDER_SITE_OTHER): Payer: BC Managed Care – PPO | Admitting: Obstetrics and Gynecology

## 2021-04-22 VITALS — BP 129/87 | HR 93 | Ht 63.0 in | Wt 152.8 lb

## 2021-04-22 DIAGNOSIS — N930 Postcoital and contact bleeding: Secondary | ICD-10-CM | POA: Diagnosis not present

## 2021-04-22 DIAGNOSIS — N951 Menopausal and female climacteric states: Secondary | ICD-10-CM | POA: Diagnosis not present

## 2021-04-22 DIAGNOSIS — N946 Dysmenorrhea, unspecified: Secondary | ICD-10-CM | POA: Diagnosis not present

## 2021-04-22 NOTE — Progress Notes (Signed)
HPI:      [name] is a 40 y.o. No obstetric history on file. who LMP was Patient's last menstrual period was 04/16/2021 (approximate).  Subjective:   She presents today because she was seen in the emergency department for pelvic pain and postcoital bleeding.  She says that sent her to see a gynecologist for these issues.  She states that on several occasions she has noticed postcoital bleeding and then subsequently had pain after intercourse. Additionally she says that she occasionally feels flushed throughout the day and wonders if these are hot flashes. She also states that she was told she may be in early menopause. She reports that she previously had ovarian cysts but her recent ultrasound in the emergency department shows that these are resolved. She says that she has previously taken OCPs and tried multiple different kinds but they all make her "crazy".  When asked for more details on what that meant she says it makes her moody and "crazy -I cannot explain any better than". She reports a family history of adenomyosis and endometriosis and states that other members of her family have had a hysterectomy at a young age.  She wonders if we can diagnose that in her. She states that her last Pap smear was more than 3 years ago. Has never had a mammogram.    Hx: The following portions of the patient's history were reviewed and updated as appropriate:             She  has a past medical history of Anxiety, Asthma, and Ovarian cyst. She does not have any pertinent problems on file. She  has a past surgical history that includes Breast lumpectomy (Right). Her family history includes Cancer in her maternal grandfather and maternal grandmother. She  reports that she has never smoked. She has never used smokeless tobacco. She reports current alcohol use. She reports that she does not use drugs. She has a current medication list which includes the following prescription(s): cyclobenzaprine  and meloxicam. She has No Known Allergies.       Review of Systems:  Review of Systems  Constitutional: Denied constitutional symptoms, night sweats, recent illness, fatigue, fever, insomnia and weight loss.  Eyes: Denied eye symptoms, eye pain, photophobia, vision change and visual disturbance.  Ears/Nose/Throat/Neck: Denied ear, nose, throat or neck symptoms, hearing loss, nasal discharge, sinus congestion and sore throat.  Cardiovascular: Denied cardiovascular symptoms, arrhythmia, chest pain/pressure, edema, exercise intolerance, orthopnea and palpitations.  Respiratory: Denied pulmonary symptoms, asthma, pleuritic pain, productive sputum, cough, dyspnea and wheezing.  Gastrointestinal: Denied, gastro-esophageal reflux, melena, nausea and vomiting.  Genitourinary: See HPI for additional information.  Musculoskeletal: Denied musculoskeletal symptoms, stiffness, swelling, muscle weakness and myalgia.  Dermatologic: Denied dermatology symptoms, rash and scar.  Neurologic: Denied neurology symptoms, dizziness, headache, neck pain and syncope.  Psychiatric: Denied psychiatric symptoms, anxiety and depression.  Endocrine: See HPI for additional information.   Meds:   Current Outpatient Medications on File Prior to Visit  Medication Sig Dispense Refill   cyclobenzaprine (FLEXERIL) 10 MG tablet Take 1 tablet (10 mg total) by mouth 3 (three) times daily as needed for muscle spasms. 30 tablet 0   meloxicam (MOBIC) 15 MG tablet Take 1 tablet (15 mg total) by mouth daily. 30 tablet 0   No current facility-administered medications on file prior to visit.      Objective:     Vitals:   04/22/21 1445  BP: 129/87  Pulse: 93   Filed Weights  04/22/21 1445  Weight: 152 lb 12.8 oz (69.3 kg)                Assessment:    No obstetric history on file. Patient Active Problem List   Diagnosis Date Noted   Neck pain, chronic 04/07/2021   Other fatigue 04/07/2021   Tachycardia  04/07/2021   Vertigo 04/07/2021   Lateral epicondylitis of left elbow 04/05/2021   Generalized anxiety disorder 10/19/2020     1. Postcoital bleeding   2. Dysmenorrhea   3. Symptomatic menopausal or female climacteric states        Plan:            1.  We had a long discussion regarding possible ways to diagnose and treat her conditions, specifically adenomyosis/endometriosis, dysmenorrhea, signs of menopause (climacteric) and the possible use of OCPs or IUD.  We discussed the surgical diagnostic nature of endometriosis.  I reassured her that her ovarian cyst were resolved because of her ultrasound.  I said it was important to examine her for a possible cervical polyp if she was having postcoital bleeding.  I informed her that with normal regular monthly menses premature menopause was unlikely but that climacteric was possible and if her symptoms were bad enough the treatment for climacteric was to add estrogen, typically birth control pills.  I discussed dysmenorrhea with her in detail and the use of ibuprofen.  I recommended that in the near future she have a mammogram and get back up-to-date on her Pap smears. I stepped out of the room to get a chaperone to do her exam and the patient got dressed and left saying that she was uncomfortable and wanted to leave. Orders No orders of the defined types were placed in this encounter.   No orders of the defined types were placed in this encounter.     F/U  No follow-ups on file. I spent 35 minutes involved in the care of this patient preparing to see the patient by obtaining and reviewing her medical history (including labs, imaging tests and prior procedures), documenting clinical information in the electronic health record (EHR), counseling and coordinating care plans, writing and sending prescriptions, ordering tests or procedures and in direct communicating with the patient and medical staff discussing pertinent items from her history and  physical exam.  Elonda Husky, M.D. 04/22/2021 3:19 PM

## 2021-04-29 ENCOUNTER — Encounter: Payer: Self-pay | Admitting: Internal Medicine

## 2021-04-30 ENCOUNTER — Other Ambulatory Visit: Payer: Self-pay | Admitting: Internal Medicine

## 2021-04-30 DIAGNOSIS — F411 Generalized anxiety disorder: Secondary | ICD-10-CM

## 2021-04-30 MED ORDER — VORTIOXETINE HBR 10 MG PO TABS: 10.0000 mg | ORAL_TABLET | Freq: Every day | ORAL | 0 refills | Status: DC

## 2021-05-03 ENCOUNTER — Ambulatory Visit
Admission: RE | Admit: 2021-05-03 | Discharge: 2021-05-03 | Disposition: A | Discharge status: Home / Self Care | Payer: BC Managed Care – PPO | Attending: Family Medicine | Admitting: Family Medicine

## 2021-05-03 ENCOUNTER — Ambulatory Visit
Admission: RE | Admit: 2021-05-03 | Discharge: 2021-05-03 | Disposition: A | Discharge status: Home / Self Care | Payer: BC Managed Care – PPO | Source: Ambulatory Visit | Attending: Family Medicine | Admitting: Family Medicine

## 2021-05-03 ENCOUNTER — Encounter: Payer: Self-pay | Admitting: Family Medicine

## 2021-05-03 ENCOUNTER — Other Ambulatory Visit: Payer: Self-pay

## 2021-05-03 ENCOUNTER — Ambulatory Visit: Payer: BC Managed Care – PPO | Admitting: Family Medicine

## 2021-05-03 VITALS — BP 126/76 | HR 102 | Temp 99.4°F | Ht 63.0 in | Wt 154.0 lb

## 2021-05-03 DIAGNOSIS — M62838 Other muscle spasm: Secondary | ICD-10-CM | POA: Diagnosis present

## 2021-05-03 DIAGNOSIS — M7712 Lateral epicondylitis, left elbow: Secondary | ICD-10-CM | POA: Diagnosis not present

## 2021-05-03 MED ORDER — TIZANIDINE HCL 4 MG PO TABS
4.0000 mg | ORAL_TABLET | Freq: Every evening | ORAL | 0 refills | Status: AC | PRN
Start: 2021-05-03 — End: 2021-06-02

## 2021-05-03 MED ORDER — MELOXICAM 15 MG PO TABS: 15.0000 mg | ORAL_TABLET | Freq: Every day | ORAL | 0 refills | Status: DC | PRN

## 2021-05-03 NOTE — Patient Instructions (Signed)
-   Obtain neck x-ray with order provided - Start physical therapy with referral provided - Can continue meloxicam on an as-needed basis for neck and/or elbow pain - Stop cyclobenzaprine, start nightly tizanidine x10 days, then dose nightly on an as-needed basis for muscle tightness pain - Return for follow-up in 4 weeks, contact for any questions between now and then

## 2021-05-03 NOTE — Assessment & Plan Note (Signed)
Patient has demonstrated excellent interval symptom improvement/near resolution.  She has been compliant with management strategy outlined at last visit, examination today reveals nontender lateral epicondyle, nontender extensor carpi radialis complex, and provocative testing greatly improved and nearly benign.  Given her excellent progress, have advised that strategy towards return to full function without the need for adjunct bracing and medication would be our next goal.  To achieve this she will wean and ultimately discontinue brace usage, transition to as needed meloxicam, start physical therapy and home exercises, and follow-up on an as-needed basis for any recurrent symptoms after physical therapy complete.  If noted, local injection, advanced imaging can all be considered.

## 2021-05-03 NOTE — Progress Notes (Signed)
Primary Care / Sports Medicine Office Visit  Patient Information:  Patient ID: Emily Gordon, female DOB: Nov 18, 1980 Age: 40 y.o. MRN: [id number]   Emily Gordon is a pleasant 40 y.o. female presenting with the following:  Chief Complaint  Patient presents with   Follow-up   Lateral epicondylitis of left elbow    Taking meloxicam and cyclobenzaprine and using elbow brace with some relief; no pain in office today    Review of Systems pertinent details above   Patient Active Problem List   Diagnosis Date Noted   Cervical paraspinal muscle spasm 05/03/2021   Neck pain, chronic 04/07/2021   Other fatigue 04/07/2021   Tachycardia 04/07/2021   Vertigo 04/07/2021   Lateral epicondylitis of left elbow 04/05/2021   Generalized anxiety disorder 10/19/2020   Past Medical History:  Diagnosis Date   Anxiety    Asthma    Ovarian cyst    Outpatient Encounter Medications as of 05/03/2021  Medication Sig   tiZANidine (ZANAFLEX) 4 MG tablet Take 1 tablet (4 mg total) by mouth at bedtime as needed for muscle spasms.   vortioxetine HBr (TRINTELLIX) 10 MG TABS tablet Take 1 tablet (10 mg total) by mouth daily.   [DISCONTINUED] cyclobenzaprine (FLEXERIL) 10 MG tablet Take 1 tablet (10 mg total) by mouth 3 (three) times daily as needed for muscle spasms.   [DISCONTINUED] meloxicam (MOBIC) 15 MG tablet Take 1 tablet (15 mg total) by mouth daily.   meloxicam (MOBIC) 15 MG tablet Take 1 tablet (15 mg total) by mouth daily as needed for pain.   No facility-administered encounter medications on file as of 05/03/2021.   Past Surgical History:  Procedure Laterality Date   BREAST LUMPECTOMY Right     Vitals:   05/03/21 1524  BP: 126/76  Pulse: (!) 102  Temp: 99.4 F (37.4 C)  SpO2: 97%   Vitals:   05/03/21 1524  Weight: 154 lb (69.9 kg)  Height: 5\' 3"  (1.6 m)   Body mass index is 27.28 kg/m.  No results found.   Independent interpretation of notes and tests performed by  another provider:   None  Procedures performed:   None  Pertinent History, Exam, Impression, and Recommendations:   Lateral epicondylitis of left elbow Patient has demonstrated excellent interval symptom improvement/near resolution.  She has been compliant with management strategy outlined at last visit, examination today reveals nontender lateral epicondyle, nontender extensor carpi radialis complex, and provocative testing greatly improved and nearly benign.  Given her excellent progress, have advised that strategy towards return to full function without the need for adjunct bracing and medication would be our next goal.  To achieve this she will wean and ultimately discontinue brace usage, transition to as needed meloxicam, start physical therapy and home exercises, and follow-up on an as-needed basis for any recurrent symptoms after physical therapy complete.  If noted, local injection, advanced imaging can all be considered.  Cervical paraspinal muscle spasm Chronic issue previously managed by neurology and most recently through her primary care provider Dr. .  States that she has EMG revealing nerve involvement in the right upper extremity, outside results to be obtained by patient.  Pain localized towards the base of her skull, upper neck, nonradiating, denies paresthesias as she has had this in the past.  Symptom control with relative rest and position modification.  Examination today reveals full painless cervical spine range of motion, maintained sensorimotor in bilateral upper extremities, trace paraspinal spasm in the right rhomboid minor, splenius  capitis, and upper trapezius regions symmetrically.  Spurling's testing is negative bilaterally.  To address her residual cervical paraspinal spasm given her current cyclobenzaprine dosing, I have advised transition to tizanidine nightly scheduled then as needed, start a physical therapy for cervical stabilization, and dedicated  cervical x-rays. Pending symptoms at return, can discuss advanced imaging, local trigger point injections, and further pharmacotherapy options as indicated.    Orders & Medications Meds ordered this encounter  Medications   tiZANidine (ZANAFLEX) 4 MG tablet    Sig: Take 1 tablet (4 mg total) by mouth at bedtime as needed for muscle spasms.    Dispense:  30 tablet    Refill:  0   meloxicam (MOBIC) 15 MG tablet    Sig: Take 1 tablet (15 mg total) by mouth daily as needed for pain.    Dispense:  30 tablet    Refill:  0    Orders Placed This Encounter  Procedures   DG Cervical Spine Complete   Ambulatory referral to Physical Therapy      Return in about 4 weeks (around 05/31/2021).     Jerrol Banana, MD   Primary Care Sports Medicine [facility]

## 2021-05-03 NOTE — Assessment & Plan Note (Addendum)
Chronic issue previously managed by neurology and most recently through her primary care provider Dr. Judithann Graves.  States that she has EMG revealing nerve involvement in the right upper extremity, outside results to be obtained by patient.  Pain localized towards the base of her skull, upper neck, nonradiating, denies paresthesias as she has had this in the past.  Symptom control with relative rest and position modification.  Examination today reveals full painless cervical spine range of motion, maintained sensorimotor in bilateral upper extremities, trace paraspinal spasm in the right rhomboid minor, splenius capitis, and upper trapezius regions symmetrically.  Spurling's testing is negative bilaterally.  To address her residual cervical paraspinal spasm given her current cyclobenzaprine dosing, I have advised transition to tizanidine nightly scheduled then as needed, start a physical therapy for cervical stabilization, and dedicated cervical x-rays. Pending symptoms at return, can discuss advanced imaging, local trigger point injections, and further pharmacotherapy options as indicated.

## 2021-05-04 NOTE — Progress Notes (Signed)
Chena, the cervical spine x-ray shows some right sided narrowing of the foramina in the upper neck - where the nerves exit the spinal cord, additionally the alignment is straightened - which is typically seen in prominent neck muscular spasm. These findings are consistent with your stated symptoms. For now, continue with the plan of medications, PT, and follow-up in 4 weeks. Please reach out for questions.

## 2021-05-14 ENCOUNTER — Ambulatory Visit: Payer: BC Managed Care – PPO | Admitting: Internal Medicine

## 2021-05-14 ENCOUNTER — Encounter: Payer: Self-pay | Admitting: Internal Medicine

## 2021-05-14 ENCOUNTER — Other Ambulatory Visit: Payer: Self-pay

## 2021-05-14 DIAGNOSIS — F411 Generalized anxiety disorder: Secondary | ICD-10-CM | POA: Diagnosis not present

## 2021-05-14 MED ORDER — VORTIOXETINE HBR 10 MG PO TABS: 10.0000 mg | ORAL_TABLET | Freq: Every day | ORAL | 2 refills | Status: DC

## 2021-05-14 NOTE — Progress Notes (Signed)
Date:  05/14/2021   Name:  Emily Gordon   DOB:  [birth year]   MRN:  [id number]   Chief Complaint: Anxiety  Anxiety Presents for follow-up (she feels great - like her old self.  Initially some fatigue but it has now resolved) visit. Patient reports no chest pain, depressed mood, dizziness, insomnia, irritability, nervous/anxious behavior or shortness of breath. Symptoms occur occasionally. The quality of sleep is good.   Compliance with medications is 76-100%.   Lab Results  Component Value Date   CREATININE 0.94 12/21/2020   BUN 12 12/21/2020   NA 138 12/21/2020   K 4.2 12/21/2020   CL 99 12/21/2020   CO2 22 12/21/2020   No results found for: CHOL, HDL, LDLCALC, LDLDIRECT, TRIG, CHOLHDL Lab Results  Component Value Date   TSH 1.740 12/21/2020   No results found for: HGBA1C Lab Results  Component Value Date   WBC 10.6 (H) 03/22/2021   HGB 13.7 03/22/2021   HCT 40.0 03/22/2021   MCV 87.5 03/22/2021   PLT 345 03/22/2021   Lab Results  Component Value Date   ALT 25 12/21/2020   AST 26 12/21/2020   ALKPHOS 98 12/21/2020   BILITOT 0.2 12/21/2020     Review of Systems  Constitutional:  Negative for appetite change, chills, fatigue, fever, irritability and unexpected weight change.  Respiratory:  Negative for cough, chest tightness and shortness of breath.   Cardiovascular:  Negative for chest pain.  Gastrointestinal:  Negative for abdominal pain, constipation and diarrhea.  Genitourinary:  Negative for menstrual problem (post coital bleeding has resolved).  Neurological:  Negative for dizziness, light-headedness and headaches.  Psychiatric/Behavioral:  Positive for sleep disturbance. Negative for dysphoric mood. The patient is not nervous/anxious and does not have insomnia.    Patient Active Problem List   Diagnosis Date Noted   Cervical paraspinal muscle spasm 05/03/2021   Neck pain, chronic 04/07/2021   Other fatigue 04/07/2021   Tachycardia 04/07/2021    Vertigo 04/07/2021   Lateral epicondylitis of left elbow 04/05/2021   Generalized anxiety disorder 10/19/2020    No Known Allergies  Past Surgical History:  Procedure Laterality Date   BREAST LUMPECTOMY Right     Social History   Tobacco Use   Smoking status: Never   Smokeless tobacco: Never  Vaping Use   Vaping Use: Never used  Substance Use Topics   Alcohol use: Yes   Drug use: Never     Medication list has been reviewed and updated.  Current Meds  Medication Sig   Cyanocobalamin (VITAMIN B-12 PO) Take by mouth.   meloxicam (MOBIC) 15 MG tablet Take 1 tablet (15 mg total) by mouth daily as needed for pain.   tiZANidine (ZANAFLEX) 4 MG tablet Take 1 tablet (4 mg total) by mouth at bedtime as needed for muscle spasms.   VITAMIN D PO Take by mouth daily.   vortioxetine HBr (TRINTELLIX) 10 MG TABS tablet Take 1 tablet (10 mg total) by mouth daily.    PHQ 2/9 Scores 05/14/2021 05/03/2021 04/05/2021 03/25/2021  PHQ - 2 Score 0 2 4 3   PHQ- 9 Score 7 13 18 17     GAD 7 : Generalized Anxiety Score 05/14/2021 05/03/2021 04/05/2021 03/25/2021  Nervous, Anxious, on Edge 0 2 3 1   Control/stop worrying 0 0 3 1  Worry too much - different things 0 1 3 1   Trouble relaxing 0 2 3 1   Restless 0 2 3 2   Easily annoyed or irritable 0 1  2 2  Afraid - awful might happen 0 0 0 0  Total GAD 7 Score 0 8 17 8   Anxiety Difficulty - Somewhat difficult Very difficult -    BP Readings from Last 3 Encounters:  05/14/21 128/82  05/03/21 126/76  04/22/21 129/87    Physical Exam Vitals and nursing note reviewed.  Constitutional:      General: She is not in acute distress.    Appearance: She is well-developed.  HENT:     Head: Normocephalic and atraumatic.  Cardiovascular:     Rate and Rhythm: Normal rate and regular rhythm.  Pulmonary:     Effort: Pulmonary effort is normal. No respiratory distress.     Breath sounds: No wheezing or rhonchi.  Musculoskeletal:     Cervical back: Normal range  of motion.  Skin:    General: Skin is warm and dry.     Findings: No rash.  Neurological:     Mental Status: She is alert and oriented to person, place, and time.  Psychiatric:        Mood and Affect: Mood normal.        Behavior: Behavior normal.    Wt Readings from Last 3 Encounters:  05/14/21 153 lb (69.4 kg)  05/03/21 154 lb (69.9 kg)  04/22/21 152 lb 12.8 oz (69.3 kg)    BP 128/82   Pulse 86   Temp 98.4 F (36.9 C) (Oral)   Ht 5\' 3"  (1.6 m)   Wt 153 lb (69.4 kg)   LMP 04/16/2021 (Approximate)   SpO2 96%   BMI 27.10 kg/m   Assessment and Plan: 1. Generalized anxiety disorder Much improved on Trintellix.  Will continue current treatment. BP and other vital signs are stable.  No weight gain noted. PA will be needed - new Rx sent.  She has failed several medications - Paxil, Zoloft, Prozac and Wellbutrin. - vortioxetine HBr (TRINTELLIX) 10 MG TABS tablet; Take 1 tablet (10 mg total) by mouth daily.  Dispense: 30 tablet; Refill: 2   Partially dictated using . Any errors are unintentional.  06/16/2021, MD [facility]  05/14/2021

## 2021-05-18 ENCOUNTER — Other Ambulatory Visit: Payer: Self-pay

## 2021-05-18 ENCOUNTER — Ambulatory Visit: Payer: BC Managed Care – PPO | Attending: Family Medicine

## 2021-05-18 DIAGNOSIS — M25522 Pain in left elbow: Secondary | ICD-10-CM | POA: Insufficient documentation

## 2021-05-18 NOTE — Patient Instructions (Addendum)
Access Code: [email or web address] URL: [email or web address] Date: 05/19/2021 Prepared by: Ria Comment  Exercises Isometric Wrist Extension Pronated - 2 x daily - 7 x weekly - 2 sets - 10 reps - 15s hold Standing Wrist Flexion Stretch - 2 x daily - 7 x weekly - 3 reps - 30-45s hold

## 2021-05-18 NOTE — Therapy (Signed)
[facility] [address] [place], [place], [address] Phone: [phone]   Fax:  [phone]  Physical Therapy Evaluation  Patient Details  Name: Emily Gordon MRN: [id number] Date of Birth: 18-Jul-1981 Referring Provider (PT): Dr Ashley Royalty  Encounter Date: 05/18/2021   PT End of Session - 05/19/21 2047     Visit Number 1    Number of Visits 17    Date for PT Re-Evaluation 07/13/21    Authorization Type eval: 05/18/21    PT Start Time 1615    PT Stop Time 1700    PT Time Calculation (min) 45 min    Activity Tolerance Patient tolerated treatment well    Behavior During Therapy [facility] for tasks assessed/performed             Past Medical History:  Diagnosis Date   Anxiety    Asthma    Ovarian cyst     Past Surgical History:  Procedure Laterality Date   BREAST LUMPECTOMY Right     There were no vitals filed for this visit.    Subjective Assessment - 05/19/21 2043     Subjective L lateral elbow pain and neck pain    Pertinent History Pt reports L lateral elbow pain x 6 months after an 8 hour car drive. She saw Dr. Judithann Graves on 03/25/21 who ordered plain film radiographs and referred her to Dr. Ashley Royalty. Elbow radiographs 03/25/21 showed no acute osseous injury of the left elbow. Independent interpretation by Dr. Ashley Royalty stated maintained articular surfaces, no cortical irregularities or roughening, no abnormal soft tissue shadow appreciated, no acute osseous process identified. He started her on Meloxicam and tennis elbow strap. He also provided her some stretches and strengthening exercises to perform at home. She followed up with him on 05/03/21 with significant improvement in her symptoms and was referred to PT. Sh still has not returned to working out with weights due to the pain. No weakness reported in LUE. Denies N/T. No radiating pain up or down LUE. Pt has a history of carpal tunnel in LUE however it spontaneously resolved.  She was also referred for cervical paraspinal spasm however upon arrival she denies any neck pain and states that she would like to focus on her elbow pain.    Limitations Lifting    Diagnostic tests See history    Patient Stated Goals Pt would like to return to lifting weights without an increase in her pain    Currently in Pain? No/denies    Pain Score 0-No pain   Worst: 2/10, Best: 0/10   Pain Location Elbow    Pain Orientation Left;Lateral    Pain Descriptors / Indicators Aching    Pain Type Chronic pain    Pain Radiating Towards None    Pain Onset More than a month ago    Pain Frequency Intermittent    Aggravating Factors  picking up dogs, lifting weights, picking up groceries    Pain Relieving Factors stretching, meloxicam, tennis elbow brace;    Effect of Pain on Daily Activities LImits ability to lift dogs, exercise (lift weights)                   SUBJECTIVE  Onset: Pt reports L lateral elbow pain x 6 months after an 8 hour car drive. She saw Dr. Judithann Graves on 03/25/21 who ordered plain film radiographs and referred her to Dr. Ashley Royalty. Elbow radiographs 03/25/21 showed no acute osseous injury of the left elbow. Independent interpretation  by Dr. Ashley Royalty stated maintained articular surfaces, no cortical irregularities or roughening, no abnormal soft tissue shadow appreciated, no acute osseous process identified. He started her on Meloxicam and tennis elbow strap. He also provided her some stretches and strengthening exercises to perform at home. She followed up with him on 05/03/21 with significant improvement in her symptoms and was referred to PT. Sh still has not returned to working out with weights due to the pain. No weakness reported in LUE. Denies N/T. No radiating pain up or down LUE. Pt has a history of carpal tunnel in LUE however it spontaneously resolved. She was also referred for cervical paraspinal spasm however upon arrival she denies any neck pain and states that she  would like to focus on her elbow pain.    Referring Dx: Cervical paraspinal spasm, left lateral epicondylitis; Referring MD: Dr. 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  Recent memory is intact.  Remote memory is intact.  Attention span and concentration are intact.  Expressive speech is intact.  Patient's fund of knowledge is within normal limits for educational level.    MUSCULOSKELETAL: Tremor: None Bulk: Normal Tone: Normal  Posture Mild forward head and rounded shoulders. Full posture assessment deferred to future session;   Cervical Screen AROM: WFL and painless with overpressure in all planes Spurlings A (ipsilateral lateral flexion/axial compression): R: Negative L: Negative Spurlings B (ipsilateral lateral flexion/contralateral rotation/axial compression): R: Negative L: Negative Repeated movement: No centralization or peripheralization with protraction or retraction Hoffman Sign (cervical cord compression): R: Positive L: Positive ULTT Median: R: Not examined L: Not examined ULTT Ulnar: R: Not examined L: Not examined ULTT Radial: R: Not examined L: Not examined   Strength R/L 4+/4+ Shoulder flexion (anterior deltoid/pec major/coracobrachialis, axillary n. (C5/6) and musculocutaneous n. (C5-7)) 5/5 Shoulder abduction  (deltoid/supraspinatus, axillary/suprascapular n, C5) 5/5 Shoulder external rotation (infraspinatus/teres minor), seated 5/5 Shoulder internal rotation (subcapularis/lats/pec major), seated 5/5 Elbow flexion (biceps brachii, brachialis, brachioradialis, musculoskeletal n, C5/6) 5/5 Elbow extension (triceps, radial n, C7) 5/5 Forearm Pronation 5/5* Forearm Supination 5/5 Wrist Extension (C6/7) 5/5 Wrist Flexion (C6/7) 5/5 Finger adduction (interossei, ulnar n, T1) 5/5 Finger abduction Grip strength: R: 55.7#, 56.8#, 53.1# (55.2#); L: 61.8#, 56.8#, 53.4# (57.3#); Cervical isometrics are strong in all directions; L 3rd digit resisted extension is painful on the left  AROM R/L Wrist extension: 70/67 Wrist flexion: 77/81 Radial Deviation: 20/22 Ulnar Deviation: 46/50 Pronation: 82/98 Supination: 90/94 *Indicates pain, overpressure performed unless otherwise indicated  Sensation Grossly intact to light touch bilateral UE as determined by testing dermatomes C2-T2 Proprioception and hot/cold testing deferred on this date  Palpation Painful to palpation to lateral mass of L elbow over ECRB insertion;  Passive Accessory Intervertebral Motion (PAIVM) Deferred cervical PAIVM testing. Also deferred passive accessory mobilizations of L elbow during evaluation.    Reflex Testing Deferred  SPECIAL TESTS Spurlings A (ipsilateral lateral flexion/axial compression): R: Negative L: Negative Spurlings B (ipsilateral  lateral flexion/contralateral rotation/axial compression): R: Negative L: Negative Distraction Test: Not examined  Hoffman Sign (cervical cord compression): R: Positive L: Positive ULTT Median: R: Not examined L: Not examined ULTT Ulnar: R: Not examined L: Not examined ULTT Radial: R: Not examined L: Not examined            Objective measurements completed on examination: See above findings.       Trigger Point Dry Needling (TDN), unbilled Education performed with  patient regarding potential benefit of TDN. Reviewed precautions and risks with patient. Extensive time spent with pt to ensure full understanding of TDN risks. Pt provided verbal consent to treatment. TDN performed to  L ECRB with 1, 0.25 x 40 single needle placements with local twitch response (LTR) and reproduction of familiar pain. Pistoning technique utilized.        PT Education - 05/19/21 2047     Education Details Plan of care and HEP    Person(s) Educated Patient    Methods Explanation;Handout    Comprehension Verbalized understanding;Returned demonstration              PT Short Term Goals - 05/19/21 2051       PT SHORT TERM GOAL #1   Title Pt will be independent with HEP in order to improve strength and decrease elbow pain in order to improve pain-free function at home and with leisure activities such as weight lifting    Time 4    Period Weeks    Status New    Target Date 06/15/21               PT Long Term Goals - 05/19/21 2052       PT LONG TERM GOAL #1   Title Pt will increase FOTO to at least 66 in order to demonstrate significant improvement in function related to L lateral elbow pain    Baseline 05/18/21: 56    Time 8    Period Weeks    Status New    Target Date 07/13/21      PT LONG TERM GOAL #2   Title Pt will report no further L lateral elbow pain with lifting groceries, lifting her dogs, and lifting weights during exercise in order to resume full function without pain    Baseline 05/18/21: Worst pain: 2/10    Time 8    Period Weeks    Status New    Target Date 07/13/21      PT LONG TERM GOAL #3   Title Pt will decrease quick DASH score by at least 8% in order to demonstrate clinically significant reduction in disability related to L lateral elbow pain    Baseline 05/18/21: to be completed at next visit    Time 8    Period Weeks    Status New    Target Date 07/13/21                    Plan - 05/19/21 2048     Clinical  Impression Statement Pt is a pleasant 40 year-old female referred for neck pain and L lateral elbow pain. Pt denies any neck pain upon arrival today and would like to focus on her L elbow. Symptoms have improved significantly since using tennis elbow brace and starting Meloxicam. She has been performing stretches as issued by Dr. Ashley Royalty however strengthening exercises increase her pain. She demonstrates full and painless cervical AROM during session today without any reproduction of LUE symptoms. She has some  pain today with palpation near L ECRB insertion with pain during resisted L 3rd MCP extension and resisted L forearm supination. L grip strength is grossly symmetrical to R side and painless. Normal L wrist flexion, extension, pronation, supination, radial deviation, and ulnar deviation compared to R side. Pt presents with deficits in L elbow pain and will benefit from skilled PT services to address deficits and return to pain-free function at home and with leisure activity. When appropriate will complete cervical examination.    Personal Factors and Comorbidities Comorbidity 1;Time since onset of injury/illness/exacerbation    Comorbidities Anxiety    Examination-Activity Limitations Lift    Examination-Participation Restrictions Community Activity;Shop    Stability/Clinical Decision Making Stable/Uncomplicated    Clinical Decision Making Low    Rehab Potential Excellent    PT Frequency 2x / week    PT Duration 8 weeks    PT Treatment/Interventions ADLs/Self Care Home Management;Aquatic Therapy;Biofeedback;Canalith Repostioning;Cryotherapy;Electrical Stimulation;Iontophoresis 4mg /ml Dexamethasone;Moist Heat;Traction;Ultrasound;Therapeutic activities;Therapeutic exercise;Balance training;Neuromuscular re-education;Orthotic Fit/Training;Manual techniques;Passive range of motion;Dry needling;Vestibular;Spinal Manipulations;Joint Manipulations    PT Next Visit Plan Assess response to TDN, review HEP,  assess passive accessory mobility of elbow, manual techniques (STM, joint mobilizations), progressive tendon loading (isometric, eccentric, concentric)    PT Home Exercise Plan Access Code: [email or web address]    Consulted and Agree with Plan of Care Patient             Patient will benefit from skilled therapeutic intervention in order to improve the following deficits and impairments:  Pain  Visit Diagnosis: Pain in left elbow     Problem List Patient Active Problem List   Diagnosis Date Noted   Cervical paraspinal muscle spasm 05/03/2021   Neck pain, chronic 04/07/2021   Other fatigue 04/07/2021   Tachycardia 04/07/2021   Vertigo 04/07/2021   Lateral epicondylitis of left elbow 04/05/2021   Generalized anxiety disorder 10/19/2020   10/21/2020 PT, DPT, GCS  Americo Vallery, PT 05/19/2021, 9:05 PM  [facility] [address] [place], [place], [place] Phone: [phone]   Fax:  [phone]  Name: Emily Gordon MRN: Jonna Clark Date of Birth: May 13, 1981

## 2021-05-20 ENCOUNTER — Other Ambulatory Visit: Payer: Self-pay

## 2021-05-20 ENCOUNTER — Ambulatory Visit: Payer: BC Managed Care – PPO

## 2021-05-20 DIAGNOSIS — M25522 Pain in left elbow: Secondary | ICD-10-CM | POA: Diagnosis not present

## 2021-05-20 NOTE — Therapy (Signed)
[facility] [address] [place], [place], [address] Phone: [phone]   Fax:  [phone]  Physical Therapy Treatment  Patient Details  Name: Emily Gordon MRN: [id number] Date of Birth: 31-Jan-1981 Referring Provider (PT): Dr Ashley Royalty   Encounter Date: 05/20/2021   PT End of Session - 05/21/21 0908     Visit Number 2    Number of Visits 17    Date for PT Re-Evaluation 07/13/21    Authorization Type eval: 05/18/21    PT Start Time 1615    PT Stop Time 1700    PT Time Calculation (min) 45 min    Activity Tolerance Patient tolerated treatment well    Behavior During Therapy [facility] for tasks assessed/performed             Past Medical History:  Diagnosis Date   Anxiety    Asthma    Ovarian cyst     Past Surgical History:  Procedure Laterality Date   BREAST LUMPECTOMY Right     There were no vitals filed for this visit.   Subjective Assessment - 05/21/21 0907     Subjective Patient reports she is doing well today.  She denies any resting pain upon arrival but continues to have pain with palpation of left lateral elbow.  She reports some soreness after trigger point dry needling during initial evaluation.  She denies any pain with left wrist extension isometrics however states that she does have some strain in the left lateral elbow during exercise.    Pertinent History Pt reports L lateral elbow pain x 6 months after an 8 hour car drive. She saw Dr. Judithann Graves on 03/25/21 who ordered plain film radiographs and referred her to Dr. Ashley Royalty. Elbow radiographs 03/25/21 showed no acute osseous injury of the left elbow. Independent interpretation by Dr. Ashley Royalty stated maintained articular surfaces, no cortical irregularities or roughening, no abnormal soft tissue shadow appreciated, no acute osseous process identified. He started her on Meloxicam and tennis elbow strap. He also provided her some stretches and strengthening exercises  to perform at home. She followed up with him on 05/03/21 with significant improvement in her symptoms and was referred to PT. Sh still has not returned to working out with weights due to the pain. No weakness reported in LUE. Denies N/T. No radiating pain up or down LUE. Pt has a history of carpal tunnel in LUE however it spontaneously resolved. She was also referred for cervical paraspinal spasm however upon arrival she denies any neck pain and states that she would like to focus on her elbow pain.    Limitations Lifting    Diagnostic tests See history    Patient Stated Goals Pt would like to return to lifting weights without an increase in her pain                   TREATMENT   Ther-ex  UBE x 4 minutes for warm up (2 minutes forward/2 minutes backwards); Isometric left wrist manually resisted extension at neutral 10-second hold 2 x10, cues and education provided for patient to perform contraction at 20 to 30% of MVC, patient was performing with maximal contraction force at home; Isometric left forearm manually resisted supination 10-second hold 2x10; Pt completed QuickDASH: 15.9% Reviewed HEP with no modifications on this date;   Manual Therapy  STM performed to left extensor mass with intermittent trigger point release; Left radial head anterior to posterior mobilizations at endrange elbow extension, grade 3,  30 seconds/bout x3 bouts; Left radial head posterior to anterior mobilizations at endrange elbow extension, grade 3, 30 seconds/bout x3 bouts; Left radial head distraction mobilization with force directed from wrist up forearm, grade 3, 30 seconds/bout x3 bouts; Left forearm medial to lateral mobilization with movement utilizing belt on forearm and therapist stabilizing upper arm while patient performs resisted gripping during lateral glide, 2 x 10; Left radial head posterior to anterior grade 5 manipulation with cavitation;   Trigger Point Dry Needling (TDN),  unbilled Education performed with patient regarding potential benefit of TDN.  Previously reviewed precautions and risks with patient. Pt provided verbal consent to treatment.  Using clean technique TDN performed to L ECRB with 2, 0.25 x 40 single needle placements with local twitch response (LTR) and reproduction of familiar pain. Pistoning technique utilized.   Pt educated throughout session about proper posture and technique with exercises. Improved exercise technique, movement at target joints, use of target muscles after min to mod verbal, visual, tactile cues.    Patient arrives excellent motivation to participate in therapy today.  Initiated soft tissue mobilization as well as radial head mobilizations during session today.  Also utilized mobilization with movement and radial head manipulation.  Repeated trigger point dry needling to left ECRB with significant ache and twitch response.  Continued with left wrist isometric extension strengthening and introduced left forearm supination isometrics.  Patient was performing maximal voluntary contraction into extension during HEP so correction provided today with instruction for patient to perform 20 to 30% of MVC at home.  No HEP modifications on this date.  Will consider progression to eccentric strengthening during next session depending on continuation of pain-free isometric strengthening.  Patient would benefit from continued PT services to address deficits in left lateral elbow pain in order to improve function at home and with leisure activities.        PT Short Term Goals - 05/19/21 2051       PT SHORT TERM GOAL #1   Title Pt will be independent with HEP in order to improve strength and decrease elbow pain in order to improve pain-free function at home and with leisure activities such as weight lifting    Time 4    Period Weeks    Status New    Target Date 06/15/21               PT Long Term Goals - 05/21/21 0922       PT  LONG TERM GOAL #1   Title Pt will increase FOTO to at least 66 in order to demonstrate significant improvement in function related to L lateral elbow pain    Baseline 05/18/21: 56    Time 8    Period Weeks    Status New    Target Date 07/13/21      PT LONG TERM GOAL #2   Title Pt will report no further L lateral elbow pain with lifting groceries, lifting her dogs, and lifting weights during exercise in order to resume full function without pain    Baseline 05/18/21: Worst pain: 2/10    Time 8    Period Weeks    Status New    Target Date 07/13/21      PT LONG TERM GOAL #3   Title Pt will decrease quick DASH score by at least 8% in order to demonstrate clinically significant reduction in disability related to L lateral elbow pain    Baseline 05/18/21: to be completed at next visit;  05/20/21: 15.9%    Time 8    Period Weeks    Status New    Target Date 07/13/21                   Plan - 05/21/21 0909     Clinical Impression Statement Patient arrives excellent motivation to participate in therapy today.  Initiated soft tissue mobilization as well as radial head mobilizations during session today.  Also utilized mobilization with movement and radial head manipulation.  Repeated trigger point dry needling to left ECRB with significant ache and twitch response.  Continued with left wrist isometric extension strengthening and introduced left forearm supination isometrics.  Patient was performing maximal voluntary contraction into extension during HEP so correction provided today with instruction for patient to perform 20 to 30% of MVC at home.  No HEP modifications on this date.  Will consider progression to eccentric strengthening during next session depending on continuation of pain-free isometric strengthening.  Patient would benefit from continued PT services to address deficits in left lateral elbow pain in order to improve function at home and with leisure activities.    Personal  Factors and Comorbidities Comorbidity 1;Time since onset of injury/illness/exacerbation    Comorbidities Anxiety    Examination-Activity Limitations Lift    Examination-Participation Restrictions Community Activity;Shop    Stability/Clinical Decision Making Stable/Uncomplicated    Rehab Potential Excellent    PT Frequency 2x / week    PT Duration 8 weeks    PT Treatment/Interventions ADLs/Self Care Home Management;Aquatic Therapy;Biofeedback;Canalith Repostioning;Cryotherapy;Electrical Stimulation;Iontophoresis 4mg /ml Dexamethasone;Moist Heat;Traction;Ultrasound;Therapeutic activities;Therapeutic exercise;Balance training;Neuromuscular re-education;Orthotic Fit/Training;Manual techniques;Passive range of motion;Dry needling;Vestibular;Spinal Manipulations;Joint Manipulations    PT Next Visit Plan Assess response to TDN, review HEP and progress as appropriate, continue manual techniques (STM, joint mobilizations), progressive tendon loading (isometric, eccentric, concentric)    PT Home Exercise Plan Access Code: [email or web address]    Consulted and Agree with Plan of Care Patient             Patient will benefit from skilled therapeutic intervention in order to improve the following deficits and impairments:  Pain  Visit Diagnosis: Pain in left elbow     Problem List Patient Active Problem List   Diagnosis Date Noted   Cervical paraspinal muscle spasm 05/03/2021   Neck pain, chronic 04/07/2021   Other fatigue 04/07/2021   Tachycardia 04/07/2021   Vertigo 04/07/2021   Lateral epicondylitis of left elbow 04/05/2021   Generalized anxiety disorder 10/19/2020   10/21/2020 PT, DPT, GCS  Marcella Dunnaway, PT 05/21/2021, 9:25 AM  [facility] [address] [place], [place], [place] Phone: [phone]   Fax:  [phone]  Name: Emily Gordon MRN: Jonna Clark Date of Birth: 1980-12-27

## 2021-05-25 ENCOUNTER — Encounter: Payer: Self-pay | Admitting: Internal Medicine

## 2021-05-25 ENCOUNTER — Telehealth: Payer: Self-pay

## 2021-05-25 ENCOUNTER — Ambulatory Visit (INDEPENDENT_AMBULATORY_CARE_PROVIDER_SITE_OTHER): Payer: BC Managed Care – PPO | Admitting: Internal Medicine

## 2021-05-25 ENCOUNTER — Ambulatory Visit: Payer: BC Managed Care – PPO

## 2021-05-25 ENCOUNTER — Other Ambulatory Visit: Payer: Self-pay

## 2021-05-25 ENCOUNTER — Other Ambulatory Visit (HOSPITAL_COMMUNITY)
Admission: RE | Admit: 2021-05-25 | Discharge: 2021-05-25 | Disposition: A | Discharge status: Home / Self Care | Payer: BC Managed Care – PPO | Source: Ambulatory Visit | Attending: Internal Medicine | Admitting: Internal Medicine

## 2021-05-25 VITALS — BP 122/70 | HR 94 | Temp 98.0°F | Resp 12 | Ht 63.0 in | Wt 155.0 lb

## 2021-05-25 DIAGNOSIS — Z124 Encounter for screening for malignant neoplasm of cervix: Secondary | ICD-10-CM | POA: Diagnosis present

## 2021-05-25 DIAGNOSIS — Z0000 Encounter for general adult medical examination without abnormal findings: Secondary | ICD-10-CM | POA: Diagnosis not present

## 2021-05-25 DIAGNOSIS — M25522 Pain in left elbow: Secondary | ICD-10-CM | POA: Diagnosis not present

## 2021-05-25 DIAGNOSIS — Z1159 Encounter for screening for other viral diseases: Secondary | ICD-10-CM

## 2021-05-25 DIAGNOSIS — Z1231 Encounter for screening mammogram for malignant neoplasm of breast: Secondary | ICD-10-CM | POA: Diagnosis not present

## 2021-05-25 NOTE — Telephone Encounter (Signed)
COMPLETED PA ON [email or web address] FOR TRINTELLIX 10 MG TABLET.   KeyLeland Johns  PA Case ID: [id number]  Awaiting outcome.

## 2021-05-25 NOTE — Therapy (Signed)
[facility] [address] [place], [place], [address] Phone: [phone]   Fax:  [phone]  Physical Therapy Treatment  Patient Details  Name: Emily Gordon MRN: [id number] Date of Birth: 1981-03-20 Referring Provider (PT): Dr Ashley Royalty   Encounter Date: 05/25/2021   PT End of Session - 05/25/21 1810     Visit Number 3    Number of Visits 17    Date for PT Re-Evaluation 07/13/21    Authorization Type eval: 05/18/21    PT Start Time 1705    PT Stop Time 1750    PT Time Calculation (min) 45 min    Activity Tolerance Patient tolerated treatment well    Behavior During Therapy [facility] for tasks assessed/performed              Past Medical History:  Diagnosis Date   Anxiety    Asthma    Ovarian cyst     Past Surgical History:  Procedure Laterality Date   BREAST LUMPECTOMY Right     There were no vitals filed for this visit.   Subjective Assessment - 05/25/21 1702     Subjective Patient reports she is doing well today.  She denies any resting pain upon arrival. No excessive soreness after last therapy session. No pain with HEP. No specific questions or concerns.    Pertinent History Pt reports L lateral elbow pain x 6 months after an 8 hour car drive. She saw Dr. Judithann Graves on 03/25/21 who ordered plain film radiographs and referred her to Dr. Ashley Royalty. Elbow radiographs 03/25/21 showed no acute osseous injury of the left elbow. Independent interpretation by Dr. Ashley Royalty stated maintained articular surfaces, no cortical irregularities or roughening, no abnormal soft tissue shadow appreciated, no acute osseous process identified. He started her on Meloxicam and tennis elbow strap. He also provided her some stretches and strengthening exercises to perform at home. She followed up with him on 05/03/21 with significant improvement in her symptoms and was referred to PT. Sh still has not returned to working out with weights due to the  pain. No weakness reported in LUE. Denies N/T. No radiating pain up or down LUE. Pt has a history of carpal tunnel in LUE however it spontaneously resolved. She was also referred for cervical paraspinal spasm however upon arrival she denies any neck pain and states that she would like to focus on her elbow pain.    Limitations Lifting    Diagnostic tests See history    Patient Stated Goals Pt would like to return to lifting weights without an increase in her pain                 TREATMENT   Ther-ex  Moist heat pack to L extensor mass during interval history x 5 minutes; L wrist extensor stretch x 30s; Isometric left wrist manually resisted extension at neutral 10-second hold x10, Isometric left forearm manually resisted supination 10-second hold x10; L wrist eccentric extension with 2# dumbbell 2 x 10 with forearm supported on table (added to HEP); L wrist eccentric supination with hammer 2 x 10 with forearm supported on table (added to HEP); Updated HEP and handout provided with education;   Manual Therapy  STM performed to left extensor mass with intermittent trigger point release; Left radial head anterior to posterior mobilizations at endrange elbow extension, grade 3, 30 seconds/bout x3 bouts; Left radial head posterior to anterior mobilizations at endrange elbow extension, grade 3, 30 seconds/bout x3 bouts; Left  radial head distraction mobilization with force directed from wrist up forearm, grade 3, 30 seconds/bout x 2 bouts; Left forearm medial to lateral mobilization with movement utilizing belt on forearm and therapist stabilizing upper arm while patient performs resisted gripping during lateral glide, 2 x 10; Left radial head posterior to anterior grade 5 manipulation with cavitation;   Trigger Point Dry Needling (TDN), unbilled Education previously performed with patient regarding potential benefit of TDN.  Previously reviewed precautions and risks with patient. Pt  provided verbal consent to treatment.  Using clean technique with L forearm supported on table and pt seated in chair TDN performed to L ECRB with 2, 0.25 x 40 single needle placements (one perpendicular placement and one threading) with local twitch response (LTR) and reproduction of familiar pain. Pistoning technique utilized.    Pt educated throughout session about proper posture and technique with exercises. Improved exercise technique, movement at target joints, use of target muscles after min to mod verbal, visual, tactile cues.    Patient arrives excellent motivation to participate in therapy today. Continued with soft tissue mobilization as well as radial head mobilizations during session today.  Also utilized mobilization with movement and radial head manipulation again today.  Repeated trigger point dry needling to left ECRB with significant ache and twitch response.  Continued with left wrist isometric extension and supination strengthening and progressed pt to eccentric L wrist extension and supination (with hammer). HEP updated. Will continue pain-free eccentric strengthening until pt can progress to pain-free concentric strengthening. Patient would benefit from continued PT services to address deficits in left lateral elbow pain in order to improve function at home and with leisure activities.        PT Short Term Goals - 05/19/21 2051       PT SHORT TERM GOAL #1   Title Pt will be independent with HEP in order to improve strength and decrease elbow pain in order to improve pain-free function at home and with leisure activities such as weight lifting    Time 4    Period Weeks    Status New    Target Date 06/15/21               PT Long Term Goals - 05/21/21 0922       PT LONG TERM GOAL #1   Title Pt will increase FOTO to at least 66 in order to demonstrate significant improvement in function related to L lateral elbow pain    Baseline 05/18/21: 56    Time 8    Period  Weeks    Status New    Target Date 07/13/21      PT LONG TERM GOAL #2   Title Pt will report no further L lateral elbow pain with lifting groceries, lifting her dogs, and lifting weights during exercise in order to resume full function without pain    Baseline 05/18/21: Worst pain: 2/10    Time 8    Period Weeks    Status New    Target Date 07/13/21      PT LONG TERM GOAL #3   Title Pt will decrease quick DASH score by at least 8% in order to demonstrate clinically significant reduction in disability related to L lateral elbow pain    Baseline 05/18/21: to be completed at next visit; 05/20/21: 15.9%    Time 8    Period Weeks    Status New    Target Date 07/13/21  Plan - 05/25/21 1742     Clinical Impression Statement Patient arrives excellent motivation to participate in therapy today. Continued with soft tissue mobilization as well as radial head mobilizations during session today.  Also utilized mobilization with movement and radial head manipulation again today.  Repeated trigger point dry needling to left ECRB with significant ache and twitch response.  Continued with left wrist isometric extension and supination strengthening and progressed pt to eccentric L wrist extension and supination (with hammer). HEP updated. Will continue pain-free eccentric strengthening until pt can progress to pain-free concentric strengthening. Patient would benefit from continued PT services to address deficits in left lateral elbow pain in order to improve function at home and with leisure activities.    Personal Factors and Comorbidities Comorbidity 1;Time since onset of injury/illness/exacerbation    Comorbidities Anxiety    Examination-Activity Limitations Lift    Examination-Participation Restrictions Community Activity;Shop    Stability/Clinical Decision Making Stable/Uncomplicated    Rehab Potential Excellent    PT Frequency 2x / week    PT Duration 8 weeks    PT  Treatment/Interventions ADLs/Self Care Home Management;Aquatic Therapy;Biofeedback;Canalith Repostioning;Cryotherapy;Electrical Stimulation;Iontophoresis 4mg /ml Dexamethasone;Moist Heat;Traction;Ultrasound;Therapeutic activities;Therapeutic exercise;Balance training;Neuromuscular re-education;Orthotic Fit/Training;Manual techniques;Passive range of motion;Dry needling;Vestibular;Spinal Manipulations;Joint Manipulations    PT Next Visit Plan Assess response to TDN, review HEP and progress as appropriate, continue manual techniques (STM, joint mobilizations), progressive tendon loading (isometric, eccentric, concentric)    PT Home Exercise Plan Access Code: [email or web address]    Consulted and Agree with Plan of Care Patient              Patient will benefit from skilled therapeutic intervention in order to improve the following deficits and impairments:  Pain  Visit Diagnosis: Pain in left elbow     Problem List Patient Active Problem List   Diagnosis Date Noted   Cervical paraspinal muscle spasm 05/03/2021   Neck pain, chronic 04/07/2021   Other fatigue 04/07/2021   Tachycardia 04/07/2021   Vertigo 04/07/2021   Lateral epicondylitis of left elbow 04/05/2021   Depression with anxiety 10/19/2020   10/21/2020 PT, DPT, GCS  Idriss Quackenbush, PT 05/25/2021, 6:16 PM  [facility] [address] [place], [place], [place] Phone: [phone]   Fax:  [phone]  Name: Emily Gordon MRN: Jonna [id number] Date of Birth: [birth year]

## 2021-05-25 NOTE — Patient Instructions (Signed)
Access Code: [email or web address] URL: [email or web address] Date: 05/25/2021 Prepared by: Ria Comment  Exercises Standing Wrist Flexion Stretch - 2 x daily - 7 x weekly - 3 reps - 30-45s hold Seated Eccentric Wrist Extension - 2 x daily - 7 x weekly - 2 sets - 10 reps - 5s lowering hold Forearm Pronation and Supination with Hammer - 2 x daily - 7 x weekly - 2 sets - 10 reps - 5s lowering hold

## 2021-05-25 NOTE — Progress Notes (Signed)
Date:  05/25/2021   Name:  Emily Gordon   DOB:  Feb 18, 1981   MRN:  [id number]   Chief Complaint: Annual Exam (Breast Exam. Pap smear. ) Alberto Schoch is a 40 y.o. female who presents today for her Complete Annual Exam. She feels well. She reports exercising - none at this time. She reports she is sleeping well. Breast complaints - none.  Mammogram: not due DEXA: none Pap smear: due Colonoscopy: not due  Clinical Intake:  Pre-visit preparation completed: Yes  Pain : No/denies pain Pain Score: 0-No pain     BMI - recorded: 27.46 Nutritional Status: BMI 25 -29 Overweight Nutritional Risks: None Diabetes: No  Activities of Daily Living: Independent Ambulation: Independent Medication Administration: Independent Home Management: Independent  Barriers to Care Management & Learning: None  Do you feel unsafe in your current relationship?: No Do you feel physically threatened by others?: No Anyone hurting you at home, work, or school?: No Information provided on Community resources: No  How often do you need to have someone help you when you read instructions, pamphlets, or other written materials from your doctor or pharmacy?: 1 - Never  Interpreter Needed?: No  Information entered by :: Margaretha Sheffield, CMA   Immunization History  Administered Date(s) Administered   PFIZER(Purple Top)SARS-COV-2 Vaccination 12/28/2019, 01/18/2020, 08/21/2020    Depression        This is a chronic problem.  The problem has been resolved since onset.  Associated symptoms include no fatigue and no headaches.  Past treatments include SNRIs - Serotonin and norepinephrine reuptake inhibitors.  Compliance with treatment is good. Elbow and neck pain - seen by Dr. Ashley Royalty, on mobic and zanaflex.  Now seeing PT and improving.  Lab Results  Component Value Date   CREATININE 0.94 12/21/2020   BUN 12 12/21/2020   NA 138 12/21/2020   K 4.2 12/21/2020   CL 99 12/21/2020   CO2 22  12/21/2020   No results found for: CHOL, HDL, LDLCALC, LDLDIRECT, TRIG, CHOLHDL Lab Results  Component Value Date   TSH 1.740 12/21/2020   No results found for: HGBA1C Lab Results  Component Value Date   WBC 10.6 (H) 03/22/2021   HGB 13.7 03/22/2021   HCT 40.0 03/22/2021   MCV 87.5 03/22/2021   PLT 345 03/22/2021   Lab Results  Component Value Date   ALT 25 12/21/2020   AST 26 12/21/2020   ALKPHOS 98 12/21/2020   BILITOT 0.2 12/21/2020     Review of Systems  Constitutional:  Negative for chills, fatigue and fever.  HENT:  Negative for congestion, hearing loss, tinnitus, trouble swallowing and voice change.   Eyes:  Negative for visual disturbance.  Respiratory:  Negative for cough, chest tightness, shortness of breath and wheezing.   Cardiovascular:  Negative for chest pain, palpitations and leg swelling.  Gastrointestinal:  Negative for abdominal pain, constipation, diarrhea and vomiting.  Endocrine: Negative for polydipsia and polyuria.  Genitourinary:  Negative for dysuria, frequency, genital sores, vaginal bleeding and vaginal discharge.  Musculoskeletal:  Negative for arthralgias, gait problem and joint swelling.  Skin:  Negative for color change and rash.  Neurological:  Negative for dizziness, tremors, light-headedness and headaches.  Hematological:  Negative for adenopathy. Does not bruise/bleed easily.  Psychiatric/Behavioral:  Positive for depression. Negative for dysphoric mood and sleep disturbance. The patient is not nervous/anxious.    Patient Active Problem List   Diagnosis Date Noted   Cervical paraspinal muscle spasm 05/03/2021   Neck pain,  chronic 04/07/2021   Other fatigue 04/07/2021   Tachycardia 04/07/2021   Vertigo 04/07/2021   Lateral epicondylitis of left elbow 04/05/2021   Generalized anxiety disorder 10/19/2020    No Known Allergies  Past Surgical History:  Procedure Laterality Date   BREAST LUMPECTOMY Right     Social History    Tobacco Use   Smoking status: Never   Smokeless tobacco: Never  Vaping Use   Vaping Use: Never used  Substance Use Topics   Alcohol use: Yes   Drug use: Never     Medication list has been reviewed and updated.  Current Meds  Medication Sig   Cyanocobalamin (VITAMIN B-12 PO) Take by mouth.   meloxicam (MOBIC) 15 MG tablet Take 1 tablet (15 mg total) by mouth daily as needed for pain.   tiZANidine (ZANAFLEX) 4 MG tablet Take 1 tablet (4 mg total) by mouth at bedtime as needed for muscle spasms.   VITAMIN D PO Take by mouth daily.   vortioxetine HBr (TRINTELLIX) 10 MG TABS tablet Take 1 tablet (10 mg total) by mouth daily.    PHQ 2/9 Scores 05/25/2021 05/14/2021 05/03/2021 04/05/2021  PHQ - 2 Score 1 0 2 4  PHQ- 9 Score 10 7 13 18     GAD 7 : Generalized Anxiety Score 05/25/2021 05/14/2021 05/03/2021 04/05/2021  Nervous, Anxious, on Edge 1 0 2 3  Control/stop worrying 1 0 0 3  Worry too much - different things 0 0 1 3  Trouble relaxing 0 0 2 3  Restless 1 0 2 3  Easily annoyed or irritable 0 0 1 2  Afraid - awful might happen 0 0 0 0  Total GAD 7 Score 3 0 8 17  Anxiety Difficulty Somewhat difficult - Somewhat difficult Very difficult    BP Readings from Last 3 Encounters:  05/25/21 122/70  05/14/21 128/82  05/03/21 126/76    Physical Exam Vitals and nursing note reviewed.  Constitutional:      General: She is not in acute distress.    Appearance: She is well-developed.  HENT:     Head: Normocephalic and atraumatic.     Right Ear: Tympanic membrane and ear canal normal.     Left Ear: Tympanic membrane and ear canal normal.     Nose:     Right Sinus: No maxillary sinus tenderness.     Left Sinus: No maxillary sinus tenderness.  Eyes:     General: No scleral icterus.       Right eye: No discharge.        Left eye: No discharge.     Conjunctiva/sclera: Conjunctivae normal.  Neck:     Thyroid: No thyromegaly.     Vascular: No carotid bruit.  Cardiovascular:     Rate  and Rhythm: Normal rate and regular rhythm.     Pulses: Normal pulses.     Heart sounds: Normal heart sounds.  Pulmonary:     Effort: Pulmonary effort is normal. No respiratory distress.     Breath sounds: No wheezing.  Chest:  Breasts:    Right: No mass, nipple discharge, skin change or tenderness.     Left: No mass, nipple discharge, skin change or tenderness.  Abdominal:     General: Bowel sounds are normal.     Palpations: Abdomen is soft.     Tenderness: There is no abdominal tenderness.  Genitourinary:    Labia:        Right: No tenderness, lesion or injury.  Left: No tenderness, lesion or injury.      Vagina: Normal.     Cervix: Normal.     Uterus: Normal.      Adnexa: Right adnexa normal and left adnexa normal.  Musculoskeletal:     Cervical back: Normal range of motion. No erythema.     Right lower leg: No edema.     Left lower leg: No edema.  Lymphadenopathy:     Cervical: No cervical adenopathy.  Skin:    General: Skin is warm and dry.     Findings: No rash.  Neurological:     Mental Status: She is alert and oriented to person, place, and time.     Cranial Nerves: No cranial nerve deficit.     Sensory: No sensory deficit.     Deep Tendon Reflexes: Reflexes are normal and symmetric.  Psychiatric:        Attention and Perception: Attention normal.        Mood and Affect: Mood normal.    Wt Readings from Last 3 Encounters:  05/25/21 155 lb (70.3 kg)  05/14/21 153 lb (69.4 kg)  05/03/21 154 lb (69.9 kg)    BP 122/70 (BP Location: Right Arm, Patient Position: Sitting, Cuff Size: Normal)   Pulse 94   Temp 98 F (36.7 C) (Oral)   Resp 12   Ht 5\' 3"  (1.6 m)   Wt 155 lb (70.3 kg)   LMP 05/10/2021 (Exact Date)   SpO2 99%   BMI 27.46 kg/m   Assessment and Plan: 1. Annual physical exam Normal exam Continue healthy diet and exercise - Comprehensive metabolic panel - Lipid panel  2. Encounter for screening for cervical cancer Obtained; STI testing  declined - done previously - Cytology - PAP  3. Need for hepatitis C screening test - Hepatitis C antibody  4. Encounter for screening mammogram for breast cancer Due in 2 months - pt will schedule - MM 3D SCREEN BREAST BILATERAL   Partially dictated using 07/10/2021. Any errors are unintentional.  Animal nutritionist, MD [facility]  05/25/2021

## 2021-05-26 ENCOUNTER — Encounter: Payer: Self-pay | Admitting: Internal Medicine

## 2021-05-26 LAB — COMPREHENSIVE METABOLIC PANEL
ALT: 82 IU/L — ABNORMAL HIGH (ref 0–32)
AST: 49 IU/L — ABNORMAL HIGH (ref 0–40)
Albumin/Globulin Ratio: 2.2 (ref 1.2–2.2)
Albumin: 4.8 g/dL (ref 3.8–4.8)
Alkaline Phosphatase: 156 IU/L — ABNORMAL HIGH (ref 44–121)
BUN/Creatinine Ratio: 13 (ref 9–23)
BUN: 10 mg/dL (ref 6–20)
Bilirubin Total: 0.4 mg/dL (ref 0.0–1.2)
CO2: 24 mmol/L (ref 20–29)
Calcium: 9.8 mg/dL (ref 8.7–10.2)
Chloride: 101 mmol/L (ref 96–106)
Creatinine, Ser: 0.77 mg/dL (ref 0.57–1.00)
Globulin, Total: 2.2 g/dL (ref 1.5–4.5)
Glucose: 86 mg/dL (ref 65–99)
Potassium: 4.6 mmol/L (ref 3.5–5.2)
Sodium: 139 mmol/L (ref 134–144)
Total Protein: 7 g/dL (ref 6.0–8.5)
eGFR: 101 mL/min/{1.73_m2} (ref >59)

## 2021-05-26 LAB — CYTOLOGY - PAP
Comment: NEGATIVE
Diagnosis: NEGATIVE
High risk HPV: NEGATIVE

## 2021-05-26 LAB — LIPID PANEL
Chol/HDL Ratio: 4.4 ratio (ref 0.0–4.4)
Cholesterol, Total: 277 mg/dL — ABNORMAL HIGH (ref 100–199)
HDL: 63 mg/dL (ref >39)
LDL Chol Calc (NIH): 196 mg/dL — ABNORMAL HIGH (ref 0–99)
Triglycerides: 103 mg/dL (ref 0–149)
VLDL Cholesterol Cal: 18 mg/dL (ref 5–40)

## 2021-05-26 LAB — HEPATITIS C ANTIBODY: Hep C Virus Ab: <0.1 s/co ratio (ref 0.0–0.9)

## 2021-05-26 NOTE — Telephone Encounter (Signed)
[place] APPROVED.  APPROVAL INFORMATION We have approved your prior authorization request for TRINTELLIX TAB 10MG  because you meet the criteria for this medication. Per your health plan, the current approval for this drug expires on 05/26/2023.

## 2021-05-27 ENCOUNTER — Ambulatory Visit: Payer: BC Managed Care – PPO

## 2021-05-27 ENCOUNTER — Other Ambulatory Visit: Payer: Self-pay

## 2021-05-27 DIAGNOSIS — M25522 Pain in left elbow: Secondary | ICD-10-CM | POA: Diagnosis not present

## 2021-05-27 NOTE — Therapy (Signed)
[facility] [address] [place], [place], [address] Phone: [phone]   Fax:  [phone]  Physical Therapy Treatment  Patient Details  Name: Emily Gordon MRN: [id number] Date of Birth: 15-Jun-1981 Referring Provider (PT): Dr Ashley Royalty   Encounter Date: 05/27/2021   PT End of Session - 05/27/21 1623     Visit Number 4    Number of Visits 17    Date for PT Re-Evaluation 07/13/21    Authorization Type eval: 05/18/21    PT Start Time 1619    PT Stop Time 1700    PT Time Calculation (min) 41 min    Activity Tolerance Patient tolerated treatment well    Behavior During Therapy [facility] for tasks assessed/performed              Past Medical History:  Diagnosis Date   Anxiety    Asthma    Ovarian cyst     Past Surgical History:  Procedure Laterality Date   BREAST LUMPECTOMY Right     There were no vitals filed for this visit.   Subjective Assessment - 05/27/21 1623     Subjective Patient reports she is doing well today.  She denies any resting pain upon arrival. No excessive soreness after last therapy session. No pain with HEP. No specific questions or concerns.    Pertinent History Pt reports L lateral elbow pain x 6 months after an 8 hour car drive. She saw Dr. Judithann Graves on 03/25/21 who ordered plain film radiographs and referred her to Dr. Ashley Royalty. Elbow radiographs 03/25/21 showed no acute osseous injury of the left elbow. Independent interpretation by Dr. Ashley Royalty stated maintained articular surfaces, no cortical irregularities or roughening, no abnormal soft tissue shadow appreciated, no acute osseous process identified. He started her on Meloxicam and tennis elbow strap. He also provided her some stretches and strengthening exercises to perform at home. She followed up with him on 05/03/21 with significant improvement in her symptoms and was referred to PT. Sh still has not returned to working out with weights due to the  pain. No weakness reported in LUE. Denies N/T. No radiating pain up or down LUE. Pt has a history of carpal tunnel in LUE however it spontaneously resolved. She was also referred for cervical paraspinal spasm however upon arrival she denies any neck pain and states that she would like to focus on her elbow pain.    Limitations Lifting    Diagnostic tests See history    Patient Stated Goals Pt would like to return to lifting weights without an increase in her pain                 TREATMENT   Ther-ex  Moist heat pack to L extensor mass during interval history x 5 minutes; L wrist extensor stretch 2 x 45s; L wrist eccentric extension with 3# dumbbell x 10, 15, 20 with forearm supported on table; L wrist eccentric supination with hammer x 10, 15, 20 with forearm supported on table;   Manual Therapy  STM performed to left extensor mass with intermittent trigger point release; Left radial head anterior to posterior mobilizations at endrange elbow extension, grade 3, 30 seconds/bout x3 bouts; Left radial head posterior to anterior mobilizations at endrange elbow extension, grade 3, 30 seconds/bout x3 bouts; Left forearm medial to lateral mobilization with movement utilizing belt on forearm and therapist stabilizing upper arm while patient performs resisted gripping during lateral glide, 2 x 10; Left radial head  posterior to anterior grade 5 manipulation with cavitation;   Pt educated throughout session about proper posture and technique with exercises. Improved exercise technique, movement at target joints, use of target muscles after min to mod verbal, visual, tactile cues.    Patient arrives excellent motivation to participate in therapy today. Continued with soft tissue mobilization as well as radial head mobilizations during session today.  Also utilized mobilization with movement and radial head manipulation again today.  Deferred trigger point dry needling to left ECRB due to some  continued soreness after last session.  Continued with left wrist eccentric extension and supination (with hammer) strengthening with increased resistance today.  All exercises are pain-free during session.  HEP not modified on this date however if strengthening remains pain-free will progress to concentric exercise next week. Patient would benefit from continued PT services to address deficits in left lateral elbow pain in order to improve function at home and with leisure activities.        PT Short Term Goals - 05/19/21 2051       PT SHORT TERM GOAL #1   Title Pt will be independent with HEP in order to improve strength and decrease elbow pain in order to improve pain-free function at home and with leisure activities such as weight lifting    Time 4    Period Weeks    Status New    Target Date 06/15/21               PT Long Term Goals - 05/21/21 0922       PT LONG TERM GOAL #1   Title Pt will increase FOTO to at least 66 in order to demonstrate significant improvement in function related to L lateral elbow pain    Baseline 05/18/21: 56    Time 8    Period Weeks    Status New    Target Date 07/13/21      PT LONG TERM GOAL #2   Title Pt will report no further L lateral elbow pain with lifting groceries, lifting her dogs, and lifting weights during exercise in order to resume full function without pain    Baseline 05/18/21: Worst pain: 2/10    Time 8    Period Weeks    Status New    Target Date 07/13/21      PT LONG TERM GOAL #3   Title Pt will decrease quick DASH score by at least 8% in order to demonstrate clinically significant reduction in disability related to L lateral elbow pain    Baseline 05/18/21: to be completed at next visit; 05/20/21: 15.9%    Time 8    Period Weeks    Status New    Target Date 07/13/21                   Plan - 05/27/21 1624     Clinical Impression Statement Patient arrives excellent motivation to participate in therapy today.  Continued with soft tissue mobilization as well as radial head mobilizations during session today.  Also utilized mobilization with movement and radial head manipulation again today.  Deferred trigger point dry needling to left ECRB due to some continued soreness after last session.  Continued with left wrist eccentric extension and supination (with hammer) strengthening with increased resistance today.  All exercises are pain-free during session.  HEP not modified on this date however if strengthening remains pain-free will progress to concentric exercise next week. Patient would benefit from continued PT  services to address deficits in left lateral elbow pain in order to improve function at home and with leisure activities.    Personal Factors and Comorbidities Comorbidity 1;Time since onset of injury/illness/exacerbation    Comorbidities Anxiety    Examination-Activity Limitations Lift    Examination-Participation Restrictions Community Activity;Shop    Stability/Clinical Decision Making Stable/Uncomplicated    Rehab Potential Excellent    PT Frequency 2x / week    PT Duration 8 weeks    PT Treatment/Interventions ADLs/Self Care Home Management;Aquatic Therapy;Biofeedback;Canalith Repostioning;Cryotherapy;Electrical Stimulation;Iontophoresis 4mg /ml Dexamethasone;Moist Heat;Traction;Ultrasound;Therapeutic activities;Therapeutic exercise;Balance training;Neuromuscular re-education;Orthotic Fit/Training;Manual techniques;Passive range of motion;Dry needling;Vestibular;Spinal Manipulations;Joint Manipulations    PT Next Visit Plan review HEP and progress as appropriate, continue manual techniques (STM, joint mobilizations), progressive tendon loading (isometric, eccentric, concentric)    PT Home Exercise Plan Access Code: [email or web address]    Consulted and Agree with Plan of Care Patient              Patient will benefit from skilled therapeutic intervention in order to improve the following deficits and  impairments:  Pain  Visit Diagnosis: Pain in left elbow     Problem List Patient Active Problem List   Diagnosis Date Noted   Cervical paraspinal muscle spasm 05/03/2021   Neck pain, chronic 04/07/2021   Other fatigue 04/07/2021   Tachycardia 04/07/2021   Vertigo 04/07/2021   Lateral epicondylitis of left elbow 04/05/2021   Depression with anxiety 10/19/2020   10/21/2020 PT, DPT, GCS  Swannie Milius, PT 05/28/2021, 12:47 PM  [facility] [address] [place], [place], [place] Phone: [phone]   Fax:  [phone]  Name: Emily Gordon MRN: Jonna Clark Date of Birth: 10-03-1980

## 2021-05-27 NOTE — Telephone Encounter (Signed)
Please review.  KP

## 2021-05-31 ENCOUNTER — Ambulatory Visit: Payer: BC Managed Care – PPO | Admitting: Internal Medicine

## 2021-06-01 ENCOUNTER — Ambulatory Visit: Payer: BC Managed Care – PPO

## 2021-06-01 ENCOUNTER — Other Ambulatory Visit: Payer: Self-pay

## 2021-06-01 DIAGNOSIS — M25522 Pain in left elbow: Secondary | ICD-10-CM

## 2021-06-01 NOTE — Therapy (Signed)
[facility] [address] [place], [place], [address] Phone: [phone]   Fax:  [phone]  Physical Therapy Treatment  Patient Details  Name: Emily Gordon MRN: [id number] Date of Birth: [birth year] Referring Provider (PT): Dr Ashley Royalty   Encounter Date: 06/01/2021   PT End of Session - 06/01/21 1718     Visit Number 5    Number of Visits 17    Date for PT Re-Evaluation 07/13/21    Authorization Type eval: 05/18/21    PT Start Time 1615    PT Stop Time 1700    PT Time Calculation (min) 45 min    Activity Tolerance Patient tolerated treatment well    Behavior During Therapy [facility] for tasks assessed/performed              Past Medical History:  Diagnosis Date   Anxiety    Asthma    Ovarian cyst     Past Surgical History:  Procedure Laterality Date   BREAST LUMPECTOMY Right     There were no vitals filed for this visit.   Subjective Assessment - 06/01/21 1620     Subjective Patient reports she is doing well today.  She denies any resting pain upon arrival. No excessive soreness after last therapy session. She did start having some soreness in her anterior elbow just medial to the extensor mass on Saturday that has continued until this morning. No pain with HEP. No specific questions or concerns.    Pertinent History Pt reports L lateral elbow pain x 6 months after an 8 hour car drive. She saw Dr. Judithann Graves on 03/25/21 who ordered plain film radiographs and referred her to Dr. Ashley Royalty. Elbow radiographs 03/25/21 showed no acute osseous injury of the left elbow. Independent interpretation by Dr. Ashley Royalty stated maintained articular surfaces, no cortical irregularities or roughening, no abnormal soft tissue shadow appreciated, no acute osseous process identified. He started her on Meloxicam and tennis elbow strap. He also provided her some stretches and strengthening exercises to perform at home. She followed up with him on  05/03/21 with significant improvement in her symptoms and was referred to PT. Sh still has not returned to working out with weights due to the pain. No weakness reported in LUE. Denies N/T. No radiating pain up or down LUE. Pt has a history of carpal tunnel in LUE however it spontaneously resolved. She was also referred for cervical paraspinal spasm however upon arrival she denies any neck pain and states that she would like to focus on her elbow pain.    Limitations Lifting    Diagnostic tests See history    Patient Stated Goals Pt would like to return to lifting weights without an increase in her pain                 TREATMENT   Ther-ex  Moist heat pack to L extensor mass during interval history x 5 minutes; L wrist extensor stretch 2 x 45s; L wrist concentric/eccentric extension with 2# dumbbell 2 x 10 with forearm supported on table; L wrist concentric/eccentric pronation and supination with hammer 2 x 10 with forearm supported on table; L wrist concentric/eccentric radial deviation with hammer 2 x 10 with forearm supported on table;   Manual Therapy  STM performed to left extensor mass with intermittent trigger point release; Left radial head anterior to posterior mobilizations at endrange elbow extension, grade 3, 30 seconds/bout x3 bouts; Left radial head posterior to anterior mobilizations at endrange  elbow extension, grade 3, 30 seconds/bout x3 bouts; Left forearm medial to lateral mobilization with movement utilizing belt on forearm and therapist stabilizing upper arm while patient performs resisted gripping during lateral glide, 2 x 10; Left radial head posterior to anterior grade 5 manipulation with cavitation;   Trigger Point Dry Needling (TDN), unbilled Education previously performed with patient regarding potential benefit of TDN.  Previously reviewed precautions and risks with patient. Pt provided verbal consent to treatment.  Using clean technique with L forearm  supported on table and pt seated in chair TDN performed to L supinator with 1, 0.25 x 40 single needle placement (flat palpation, perpendicular placement) as well as L extensor mass with 1, 0.25 x  60 single needle placement (threading medial/anterior to lateral/posterior) with local twitch response (LTR) during both placements as well as reproduction of familiar pain. Pistoning technique utilized.    Pt educated throughout session about proper posture and technique with exercises. Improved exercise technique, movement at target joints, use of target muscles after min to mod verbal, visual, tactile cues.    Patient arrives excellent motivation to participate in therapy today. Continued with soft tissue mobilization as well as radial head mobilizations during session today.  Also utilized mobilization with movement and radial head manipulation again today.  Repeated trigger point dry needling to left extensor mass as well as left supinator.  Progressed left forearm strengthening to concentric and eccentric phases and added left wrist radial deviation. All exercises are pain-free during session.  HEP advanced today to include concentric phases of each exercise as well as addition of radial deviation strengthening. Patient would benefit from continued PT services to address deficits in left lateral elbow pain in order to improve function at home and with leisure activities.        PT Short Term Goals - 05/19/21 2051       PT SHORT TERM GOAL #1   Title Pt will be independent with HEP in order to improve strength and decrease elbow pain in order to improve pain-free function at home and with leisure activities such as weight lifting    Time 4    Period Weeks    Status New    Target Date 06/15/21               PT Long Term Goals - 05/21/21 0922       PT LONG TERM GOAL #1   Title Pt will increase FOTO to at least 66 in order to demonstrate significant improvement in function related to L  lateral elbow pain    Baseline 05/18/21: 56    Time 8    Period Weeks    Status New    Target Date 07/13/21      PT LONG TERM GOAL #2   Title Pt will report no further L lateral elbow pain with lifting groceries, lifting her dogs, and lifting weights during exercise in order to resume full function without pain    Baseline 05/18/21: Worst pain: 2/10    Time 8    Period Weeks    Status New    Target Date 07/13/21      PT LONG TERM GOAL #3   Title Pt will decrease quick DASH score by at least 8% in order to demonstrate clinically significant reduction in disability related to L lateral elbow pain    Baseline 05/18/21: to be completed at next visit; 05/20/21: 15.9%    Time 8    Period Weeks  Status New    Target Date 07/13/21                   Plan - 06/01/21 1719     Clinical Impression Statement Patient arrives excellent motivation to participate in therapy today. Continued with soft tissue mobilization as well as radial head mobilizations during session today.  Also utilized mobilization with movement and radial head manipulation again today.  Repeated trigger point dry needling to left extensor mass as well as left supinator.  Progressed left forearm strengthening to concentric and eccentric phases and added left wrist radial deviation. All exercises are pain-free during session.  HEP advanced today to include concentric phases of each exercise as well as addition of radial deviation strengthening. Patient would benefit from continued PT services to address deficits in left lateral elbow pain in order to improve function at home and with leisure activities.    Personal Factors and Comorbidities Comorbidity 1;Time since onset of injury/illness/exacerbation    Comorbidities Anxiety    Examination-Activity Limitations Lift    Examination-Participation Restrictions Community Activity;Shop    Stability/Clinical Decision Making Stable/Uncomplicated    Rehab Potential Excellent     PT Frequency 2x / week    PT Duration 8 weeks    PT Treatment/Interventions ADLs/Self Care Home Management;Aquatic Therapy;Biofeedback;Canalith Repostioning;Cryotherapy;Electrical Stimulation;Iontophoresis 4mg /ml Dexamethasone;Moist Heat;Traction;Ultrasound;Therapeutic activities;Therapeutic exercise;Balance training;Neuromuscular re-education;Orthotic Fit/Training;Manual techniques;Passive range of motion;Dry needling;Vestibular;Spinal Manipulations;Joint Manipulations    PT Next Visit Plan review HEP and progress as appropriate, continue manual techniques (STM, joint mobilizations), progressive tendon loading (isometric, eccentric, concentric)    PT Home Exercise Plan Access Code: [email or web address]    Consulted and Agree with Plan of Care Patient              Patient will benefit from skilled therapeutic intervention in order to improve the following deficits and impairments:  Pain  Visit Diagnosis: Pain in left elbow     Problem List Patient Active Problem List   Diagnosis Date Noted   Cervical paraspinal muscle spasm 05/03/2021   Neck pain, chronic 04/07/2021   Other fatigue 04/07/2021   Tachycardia 04/07/2021   Vertigo 04/07/2021   Lateral epicondylitis of left elbow 04/05/2021   Depression with anxiety 10/19/2020   10/21/2020 PT, DPT, GCS  Rosezella Kronick, PT 06/01/2021, 5:42 PM  [facility] [address] [place], [place], [place] Phone: [phone]   Fax:  [phone]  Name: Emily Gordon MRN: Jonna Clark Date of Birth: [birth year]

## 2021-06-03 ENCOUNTER — Ambulatory Visit: Payer: BC Managed Care – PPO

## 2021-06-03 ENCOUNTER — Other Ambulatory Visit: Payer: Self-pay

## 2021-06-03 DIAGNOSIS — M25522 Pain in left elbow: Secondary | ICD-10-CM | POA: Diagnosis not present

## 2021-06-03 LAB — ACUTE VIRAL HEPATITIS (HAV, HBV, HCV)
HCV Ab: <0.1 s/co ratio (ref 0.0–0.9)
Hep A IgM: NEGATIVE
Hep B C IgM: NEGATIVE
Hepatitis B Surface Ag: NEGATIVE

## 2021-06-03 LAB — HCV INTERPRETATION

## 2021-06-03 LAB — SPECIMEN STATUS REPORT

## 2021-06-03 NOTE — Therapy (Signed)
[facility] [address] [place], [place], [address] Phone: [phone]   Fax:  [phone]  Physical Therapy Treatment  Patient Details  Name: Emily Gordon MRN: [id number] Date of Birth: May 24, 1981 Referring Provider (PT): Dr Ashley Royalty   Encounter Date: 06/03/2021   PT End of Session - 06/03/21 1717     Visit Number 6    Number of Visits 17    Date for PT Re-Evaluation 07/13/21    Authorization Type eval: 05/18/21    PT Start Time 1615    PT Stop Time 1700    PT Time Calculation (min) 45 min    Activity Tolerance Patient tolerated treatment well    Behavior During Therapy [facility] for tasks assessed/performed               Past Medical History:  Diagnosis Date   Anxiety    Asthma    Ovarian cyst     Past Surgical History:  Procedure Laterality Date   BREAST LUMPECTOMY Right     There were no vitals filed for this visit.   Subjective Assessment - 06/03/21 1709     Subjective Patient reports she is doing well today.  She denies any resting pain upon arrival. No excessive soreness after last therapy session. She has continued to have some soreness in her anterior elbow just medial to the extensor mass. She also reports another epsiode of dizziness recently which was associated with some mild chest pain and tachycardia. This has happened previously and MD is aware. No pain with HEP. No specific questions or concerns.    Pertinent History Pt reports L lateral elbow pain x 6 months after an 8 hour car drive. She saw Dr. Judithann Graves on 03/25/21 who ordered plain film radiographs and referred her to Dr. Ashley Royalty. Elbow radiographs 03/25/21 showed no acute osseous injury of the left elbow. Independent interpretation by Dr. Ashley Royalty stated maintained articular surfaces, no cortical irregularities or roughening, no abnormal soft tissue shadow appreciated, no acute osseous process identified. He started her on Meloxicam and tennis elbow  strap. He also provided her some stretches and strengthening exercises to perform at home. She followed up with him on 05/03/21 with significant improvement in her symptoms and was referred to PT. Sh still has not returned to working out with weights due to the pain. No weakness reported in LUE. Denies N/T. No radiating pain up or down LUE. Pt has a history of carpal tunnel in LUE however it spontaneously resolved. She was also referred for cervical paraspinal spasm however upon arrival she denies any neck pain and states that she would like to focus on her elbow pain.    Limitations Lifting    Diagnostic tests See history    Patient Stated Goals Pt would like to return to lifting weights without an increase in her pain                  TREATMENT   Ther-ex  Moist heat pack to L extensor mass during interval history x 5 minutes; L wrist extensor stretch 3 x 45s; L wrist concentric/eccentric extension with 3# dumbbell 2 x 10 with forearm supported on table; L wrist concentric/eccentric pronation and supination with hammer 2 x 10 with forearm supported on table; L wrist concentric/eccentric radial deviation with hammer 2 x 10 with forearm supported on table;   Manual Therapy  STM performed to left extensor mass with intermittent trigger point release; Left radial head anterior to  posterior mobilizations at endrange elbow extension, grade 3, 30 seconds/bout x3 bouts; Left radial head posterior to anterior mobilizations at endrange elbow extension, grade 3, 30 seconds/bout x3 bouts; Left forearm medial to lateral mobilization with movement utilizing belt on forearm and therapist stabilizing upper arm while patient performs resisted gripping during lateral glide, x 10, second set deferred due to pain in anterior elbow; Left radial head posterior to anterior grade 5 manipulation with cavitation;   Trigger Point Dry Needling (TDN), unbilled Education previously performed with patient  regarding potential benefit of TDN.  Previously reviewed precautions and risks with patient. Pt provided verbal consent to treatment.  Using clean technique with L forearm supported on table and pt seated in chair TDN performed to L bicep with 2, 0.25 x 60 single needle placements with local twitch response (LTR) during second placements. Pistoning technique utilized.    Pt educated throughout session about proper posture and technique with exercises. Improved exercise technique, movement at target joints, use of target muscles after min to mod verbal, visual, tactile cues.    Patient arrives excellent motivation to participate in therapy today. Continued with soft tissue mobilization as well as radial head mobilizations during session today.  Also utilized mobilization with movement and radial head manipulation again today.  Avoided trigger point dry needling to left extensor mass today however performed on L bicep due to more medial L elbow pain. Continued left forearm concentric and eccentric strengthening. All exercises are pain-free during session. No HEP modifications at this time. Patient would benefit from continued PT services to address deficits in left lateral elbow pain in order to improve function at home and with leisure activities.        PT Short Term Goals - 05/19/21 2051       PT SHORT TERM GOAL #1   Title Pt will be independent with HEP in order to improve strength and decrease elbow pain in order to improve pain-free function at home and with leisure activities such as weight lifting    Time 4    Period Weeks    Status New    Target Date 06/15/21               PT Long Term Goals - 05/21/21 0922       PT LONG TERM GOAL #1   Title Pt will increase FOTO to at least 66 in order to demonstrate significant improvement in function related to L lateral elbow pain    Baseline 05/18/21: 56    Time 8    Period Weeks    Status New    Target Date 07/13/21      PT LONG  TERM GOAL #2   Title Pt will report no further L lateral elbow pain with lifting groceries, lifting her dogs, and lifting weights during exercise in order to resume full function without pain    Baseline 05/18/21: Worst pain: 2/10    Time 8    Period Weeks    Status New    Target Date 07/13/21      PT LONG TERM GOAL #3   Title Pt will decrease quick DASH score by at least 8% in order to demonstrate clinically significant reduction in disability related to L lateral elbow pain    Baseline 05/18/21: to be completed at next visit; 05/20/21: 15.9%    Time 8    Period Weeks    Status New    Target Date 07/13/21  Plan - 06/03/21 1718     Clinical Impression Statement Patient arrives excellent motivation to participate in therapy today. Continued with soft tissue mobilization as well as radial head mobilizations during session today.  Also utilized mobilization with movement and radial head manipulation again today.  Avoided trigger point dry needling to left extensor mass today however performed on L bicep due to more medial L elbow pain. Continued left forearm concentric and eccentric strengthening. All exercises are pain-free during session. No HEP modifications at this time. Patient would benefit from continued PT services to address deficits in left lateral elbow pain in order to improve function at home and with leisure activities.    Personal Factors and Comorbidities Comorbidity 1;Time since onset of injury/illness/exacerbation    Comorbidities Anxiety    Examination-Activity Limitations Lift    Examination-Participation Restrictions Community Activity;Shop    Stability/Clinical Decision Making Stable/Uncomplicated    Rehab Potential Excellent    PT Frequency 2x / week    PT Duration 8 weeks    PT Treatment/Interventions ADLs/Self Care Home Management;Aquatic Therapy;Biofeedback;Canalith Repostioning;Cryotherapy;Electrical Stimulation;Iontophoresis 4mg /ml  Dexamethasone;Moist Heat;Traction;Ultrasound;Therapeutic activities;Therapeutic exercise;Balance training;Neuromuscular re-education;Orthotic Fit/Training;Manual techniques;Passive range of motion;Dry needling;Vestibular;Spinal Manipulations;Joint Manipulations    PT Next Visit Plan review HEP and progress as appropriate, continue manual techniques (STM, joint mobilizations), progressive tendon loading (isometric, eccentric, concentric)    PT Home Exercise Plan Access Code: [email or web address]    Consulted and Agree with Plan of Care Patient               Patient will benefit from skilled therapeutic intervention in order to improve the following deficits and impairments:  Pain  Visit Diagnosis: Pain in left elbow     Problem List Patient Active Problem List   Diagnosis Date Noted   Cervical paraspinal muscle spasm 05/03/2021   Neck pain, chronic 04/07/2021   Other fatigue 04/07/2021   Tachycardia 04/07/2021   Vertigo 04/07/2021   Lateral epicondylitis of left elbow 04/05/2021   Depression with anxiety 10/19/2020   10/21/2020 PT, DPT, GCS  Dickie Labarre, PT 06/04/2021, 4:58 PM  [facility] [address] [place], [place], [place] Phone: [phone]   Fax:  [phone]  Name: Emily Gordon MRN: Jonna Clark Date of Birth: 04-11-1981

## 2021-06-04 ENCOUNTER — Ambulatory Visit: Payer: BC Managed Care – PPO | Admitting: Family Medicine

## 2021-06-07 ENCOUNTER — Other Ambulatory Visit: Payer: Self-pay

## 2021-06-07 ENCOUNTER — Ambulatory Visit: Payer: 59 | Admitting: Family Medicine

## 2021-06-07 ENCOUNTER — Encounter: Payer: Self-pay | Admitting: Family Medicine

## 2021-06-07 VITALS — BP 118/76 | HR 89 | Ht 63.0 in | Wt 154.0 lb

## 2021-06-07 DIAGNOSIS — M4802 Spinal stenosis, cervical region: Secondary | ICD-10-CM | POA: Diagnosis not present

## 2021-06-07 DIAGNOSIS — M7712 Lateral epicondylitis, left elbow: Secondary | ICD-10-CM

## 2021-06-07 DIAGNOSIS — M62838 Other muscle spasm: Secondary | ICD-10-CM

## 2021-06-07 NOTE — Assessment & Plan Note (Signed)
Patient has not had an opportunity to start physical therapy as of yet, has been focusing on left elbow.  She denies any radicular features, pain is right greater than left.  Her x-rays reveal minor foraminal narrowing right greater than left as well.  From physical exam standpoint she does have slightly limited rightward torsion and lateral tilt, sensorimotor otherwise intact in bilateral upper extremities.  I reviewed treatment strategies and have encouraged her to have physical therapy start working on her cervical spine, specifically elements of upper crossed syndrome that were noted today with muscular deconditioning.  I did provide home exercises for her to start right away.  We will transition to topical diclofenac 2% on an as-needed basis, utilize moist heat for supportive care/natural muscle relaxation.  Additionally, I have encouraged her to modify her workplace environment so as not to aggravate cervical spine and elbow.  Appropriate ergonomics was reviewed with patient.  She was advised to contact our office at the 6-week mark or beyond for suboptimal progress, if so, we can discuss local trigger point injections versus advanced imaging and referral to pain and spine.

## 2021-06-07 NOTE — Patient Instructions (Signed)
-   Can use topical anti-inflammatory Pennsaid up to twice a day on an as-needed basis for neck/shoulder or left elbow pain - Continue with physical therapy, have them incorporate neck exercises, referral is in your chart - Modify work environment as discussed, Audiological scientist up - Start home exercises with information provided, these will be optimized by physical therapy - Contact our office at the 6-week mark or beyond for any limited progress of the neck and/or elbow - Otherwise, contact for questions and follow-up as needed

## 2021-06-07 NOTE — Progress Notes (Signed)
Primary Care / Sports Medicine Office Visit  Patient Information:  Patient ID: Cecille Mcclusky, female DOB: Dec 19, 1980 Age: 40 y.o. MRN: [id number]   Kamee Bobst is a pleasant 40 y.o. 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   VITAMIN D PO Take 1 capsule by mouth daily.   vortioxetine HBr (TRINTELLIX) 10 MG TABS tablet Take 1 tablet (10 mg total) by mouth daily.   meloxicam (MOBIC) 15 MG tablet Take 1 tablet (15 mg total) by mouth daily as needed for pain. (Patient not taking: Reported on 06/07/2021)   No facility-administered encounter medications on file as of 06/07/2021.   Past Surgical History:  Procedure Laterality Date   BREAST LUMPECTOMY Right     Vitals:   06/07/21 1518  BP: 118/76  Pulse: 89  SpO2: 99%   Vitals:   06/07/21 1518  Weight: 154 lb (69.9 kg)  Height: 5\' 3"  (1.6 m)   Body mass index is 27.28 kg/m.  No results found.   Independent interpretation of notes and tests performed by another provider:   Independent interpretation of cervical spine x-rays dated 05/03/2021 reveals straightening of the expected cervical lordosis, no significant  intervertebral narrowing or facet arthropathy, there is right greater than left upper foraminal stenosis, no acute osseous process identified  Procedures performed:   None  Pertinent History, Exam, Impression, and Recommendations:   Cervical paraspinal muscle spasm Patient has not had an opportunity to start physical therapy as of yet, has been focusing on left elbow.  She denies any radicular features, pain is right greater than left.  Her x-rays reveal minor foraminal narrowing right greater than left as well.  From physical exam standpoint she does have slightly limited rightward torsion and lateral tilt, sensorimotor otherwise intact in bilateral upper extremities.  I reviewed treatment strategies and have encouraged her to have physical therapy start working on her cervical spine, specifically elements of upper crossed syndrome that were noted today with muscular deconditioning.  I did provide home exercises for her to start right away.  We will transition to topical diclofenac 2% on an as-needed basis, utilize moist heat for supportive care/natural muscle relaxation.  Additionally, I have encouraged her to modify her workplace environment so as not to aggravate cervical spine and elbow.  Appropriate ergonomics was reviewed with patient.  She was advised to contact our office at the 6-week mark or beyond for suboptimal progress, if so, we can discuss local trigger point injections versus advanced imaging and referral to pain and spine.  Foraminal stenosis of cervical region See additional assessment(s) for plan details.  Lateral  epicondylitis of left elbow Patient continues to demonstrate interval improvement though she has had minor relapse in symptomatology localizing to the extensor carpi radialis complex, lateral epicondyle nontender.  She has discontinued meloxicam and tizanidine citing LFT increase.  I have advised as needed usage of topical diclofenac 2% as an adjunct to continued  physical therapy, home exercises, and have encouraged her to modify ergonomics at her computer workstation.  She is to contact us in 6 weeks for suboptimal progress, if noted local injection versus advanced imaging to be considered.   Orders & Medications No orders of the defined types were placed in this encounter.  No orders of the defined types were placed in this encounter.    Return if symptoms worsen or fail to improve.     Jerrol Banana, MD   Primary Care Sports Medicine [facility]

## 2021-06-07 NOTE — Assessment & Plan Note (Signed)
Patient continues to demonstrate interval improvement though she has had minor relapse in symptomatology localizing to the extensor carpi radialis complex, lateral epicondyle nontender.  She has discontinued meloxicam and tizanidine citing LFT increase.  I have advised as needed usage of topical diclofenac 2% as an adjunct to continued physical therapy, home exercises, and have encouraged her to modify ergonomics at her computer workstation.  She is to contact us in 6 weeks for suboptimal progress, if noted local injection versus advanced imaging to be considered.

## 2021-06-07 NOTE — Assessment & Plan Note (Signed)
See additional assessment(s) for plan details. 

## 2021-06-08 ENCOUNTER — Ambulatory Visit: Payer: 59 | Attending: Family Medicine

## 2021-06-08 ENCOUNTER — Ambulatory Visit: Payer: BC Managed Care – PPO | Admitting: Internal Medicine

## 2021-06-08 DIAGNOSIS — M25522 Pain in left elbow: Secondary | ICD-10-CM | POA: Diagnosis not present

## 2021-06-08 NOTE — Therapy (Signed)
[facility] [address] [place], [place], [address] Phone: [phone]   Fax:  [phone]  Physical Therapy Treatment  Patient Details  Name: Elloise Roark MRN: [id number] Date of Birth: 1981/04/07 Referring Provider (PT): Dr Ashley Royalty   Encounter Date: 06/08/2021   PT End of Session - 06/08/21 1722     Visit Number 7    Number of Visits 17    Date for PT Re-Evaluation 07/13/21    Authorization Type [employer] N[employer]    Authorization Time Period 05/18/21-07/13/21    Progress Note Due on Visit 10    PT Start Time 1616    PT Stop Time 1703    PT Time Calculation (min) 47 min    Activity Tolerance Patient tolerated treatment well;No increased pain    Behavior During Therapy WFL for tasks assessed/performed             Past Medical History:  Diagnosis Date   Anxiety    Asthma    Ovarian cyst     Past Surgical History:  Procedure Laterality Date   BREAST LUMPECTOMY Right     There were no vitals filed for this visit.   Subjective Assessment - 06/08/21 1622     Subjective Pt reports doing fairly well today in general, HEP still going well. Pt reports some increased pain after use of belt for MWM last session, also pain with end range elbow flexion- this has since resolved. Pt saw Dr. Ashley Royalty on 10/4, voicing when patient would be able to begin PT for her chronic neck pain. Pt has still not returned to gym workouts. She denies any associated workoutpain, but does endorse some pain with elbow flexoin activity.    Pertinent History Pt reports L lateral elbow pain x 6 months after an 8 hour car drive. She saw Dr. Judithann Graves on 03/25/21 who ordered plain film radiographs and referred her to Dr. Ashley Royalty. Elbow radiographs 03/25/21 showed no acute osseous injury of the left elbow. Independent interpretation by Dr. Ashley Royalty stated maintained articular surfaces, no cortical irregularities or roughening, no abnormal soft tissue shadow  appreciated, no acute osseous process identified. He started her on Meloxicam and tennis elbow strap. He also provided her some stretches and strengthening exercises to perform at home. She followed up with him on 05/03/21 with significant improvement in her symptoms and was referred to PT. Sh still has not returned to working out with weights due to the pain. No weakness reported in LUE. Denies N/T. No radiating pain up or down LUE. Pt has a history of carpal tunnel in LUE however it spontaneously resolved. She was also referred for cervical paraspinal spasm however upon arrival she denies any neck pain and states that she would like to focus on her elbow pain.    Patient Stated Goals Pt would like to return to lifting weights without an increase in her pain    Currently in Pain? No/denies             INTERVENTION THIS DATE: -moist heat application to volar elbow -reassessment of elbow joint P/ROM: flexion, extension, carry angle, valgus/varus stress, supination at 90 degrees -noted pain with end range supination actively, no pain passively -circumferential assessment revealing of 1cm increased circumference on left at cubital crease, 1.5cm decreased circumference at 7cm proximal cubital crease. Noted mild generalized edema visualized most notable along the medial elbow above and below the cubital crease, non tender.  -Radial head joint mobility WNL, non tender -biceps  brachii muscle mass mild tenderness medially, non tender -focal tenderness and hypertonicity with taught bands in the brachioradialis Left, proximal 4 inches  -Left brachioradialis MFR/ART paired with passive elbow flexion x10 minutes  -9lb elbow flexion to target brachioradialis 1x12 bilat; moderate subjective weakness on Left, ache in proximal brachioradialis  -full supinated Left elbow flexion 90 degrees to end-range 10 (progressive decrease of achiness toward end of set)       PT Education - 06/08/21 1721     Education  Details Thought process on trouble shooting exam this date    Person(s) Educated Patient    Methods Explanation    Comprehension Verbalized understanding;Returned demonstration              PT Short Term Goals - 05/19/21 2051       PT SHORT TERM GOAL #1   Title Pt will be independent with HEP in order to improve strength and decrease elbow pain in order to improve pain-free function at home and with leisure activities such as weight lifting    Time 4    Period Weeks    Status New    Target Date 06/15/21               PT Long Term Goals - 05/21/21 0922       PT LONG TERM GOAL #1   Title Pt will increase FOTO to at least 66 in order to demonstrate significant improvement in function related to L lateral elbow pain    Baseline 05/18/21: 56    Time 8    Period Weeks    Status New    Target Date 07/13/21      PT LONG TERM GOAL #2   Title Pt will report no further L lateral elbow pain with lifting groceries, lifting her dogs, and lifting weights during exercise in order to resume full function without pain    Baseline 05/18/21: Worst pain: 2/10    Time 8    Period Weeks    Status New    Target Date 07/13/21      PT LONG TERM GOAL #3   Title Pt will decrease quick DASH score by at least 8% in order to demonstrate clinically significant reduction in disability related to L lateral elbow pain    Baseline 05/18/21: to be completed at next visit; 05/20/21: 15.9%    Time 8    Period Weeks    Status New    Target Date 07/13/21                   Plan - 06/08/21 1723     Clinical Impression Statement Continued with soft tissue mobilization and triggerpoint release in elbow. Most tender spaces are focal to proximal most 3rd of brachioradialis. Also noted generalized edema on the Left elbow, and circumferential suggestion of muscle mass loss on left. Elbow flexion remains moderately weak compared to Rt, focal symptoms with isolated loading of brachiopradialis. Pt  tolerated session well, will followup with primary therapist regarding plan moving forward.    Personal Factors and Comorbidities Comorbidity 1;Time since onset of injury/illness/exacerbation    Comorbidities Anxiety    Examination-Activity Limitations Lift    Examination-Participation Restrictions Community Activity;Shop    Stability/Clinical Decision Making Stable/Uncomplicated    Clinical Decision Making Low    Rehab Potential Excellent    PT Frequency 2x / week    PT Duration 8 weeks    PT Treatment/Interventions ADLs/Self Care Home Management;Aquatic Therapy;Biofeedback;Canalith Repostioning;Cryotherapy;Electrical Stimulation;Iontophoresis  4mg /ml Dexamethasone;Moist Heat;Traction;Ultrasound;Therapeutic activities;Therapeutic exercise;Balance training;Neuromuscular re-education;Orthotic Fit/Training;Manual techniques;Passive range of motion;Dry needling;Vestibular;Spinal Manipulations;Joint Manipulations    PT Next Visit Plan revisit moderate loading in isolated supinated elbow flexion, range limited; symptoms response; update HEP as needed.    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 [place], [place], [place] Phone: [phone]   Fax:  [phone]  Name: Faythe Heitzenrater MRN: Jonna Clark Date of Birth: 1981/01/29

## 2021-06-10 ENCOUNTER — Encounter: Payer: Self-pay | Admitting: Physical Therapy

## 2021-06-10 ENCOUNTER — Ambulatory Visit: Payer: 59 | Admitting: Physical Therapy

## 2021-06-10 ENCOUNTER — Other Ambulatory Visit: Payer: Self-pay

## 2021-06-10 DIAGNOSIS — M25522 Pain in left elbow: Secondary | ICD-10-CM

## 2021-06-10 NOTE — Therapy (Signed)
[facility] [address] [place], [place], [address] Phone: [phone]   Fax:  [phone]  Physical Therapy Treatment  Patient Details  Name: Emily Gordon MRN: [id number] Date of Birth: 11-24-1980 Referring Provider (PT): Dr Ashley Royalty   Encounter Date: 06/10/2021   PT End of Session - 06/10/21 1705     Visit Number 8    Number of Visits 17    Date for PT Re-Evaluation 07/13/21    Authorization Type [employer] NAP    Authorization Time Period 05/18/21-07/13/21    Progress Note Due on Visit 10    PT Start Time 1600    PT Stop Time 1650    PT Time Calculation (min) 50 min    Activity Tolerance Patient tolerated treatment well;No increased pain    Behavior During Therapy WFL for tasks assessed/performed             Past Medical History:  Diagnosis Date   Anxiety    Asthma    Ovarian cyst     Past Surgical History:  Procedure Laterality Date   BREAST LUMPECTOMY Right     There were no vitals filed for this visit.   Subjective Assessment - 06/10/21 1659     Subjective Pt reports she is well today, she is compliant with HEP. She does report pain has remained aggrevated since last session and that primary pain has moved into the antecubitral fossa and into distal/lateral upper arm. 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    Pertinent History Pt reports L lateral elbow pain x 6 months after an 8 hour car drive. She saw Dr. Judithann Graves on 03/25/21 who ordered plain film radiographs and referred her to Dr. Ashley Royalty. Elbow radiographs 03/25/21 showed no acute osseous injury of the left elbow. Independent interpretation by Dr. Ashley Royalty stated maintained articular surfaces, no cortical irregularities or roughening, no abnormal soft tissue shadow appreciated, no acute osseous process identified. He started her on Meloxicam and tennis elbow strap. He also provided her some stretches and strengthening  exercises to perform at home. She followed up with him on 05/03/21 with significant improvement in her symptoms and was referred to PT. Sh still has not returned to working out with weights due to the pain. No weakness reported in LUE. Denies N/T. No radiating pain up or down LUE. Pt has a history of carpal tunnel in LUE however it spontaneously resolved. She was also referred for cervical paraspinal spasm however upon arrival she denies any neck pain and states that she would like to focus on her elbow pain.    Patient Stated Goals Pt would like to return to lifting weights without an increase in her pain    Currently in Pain? No/denies                TREATMENT     Ther-ex  Moist heat pack to L extensor mass during interval history x 5 minutes (unbilled); L wrist extensor stretch 3 x 45s; L wrist concentric/eccentric extension with 4# dumbbell 3 x 10 with forearm supported on table; L concentric/eccentric hammer curl 4# dumbbell 3 x 10 L wrist concentric/eccentric pronation and supination with hammer 2 x 10 with forearm supported on table; L wrist concentric/eccentric radial deviation with hammer 2 x 10 with forearm supported on table;  Theraband Flex-bar, red: forearm pronation and supination with elbows stabilized at sides, 2x10 each    Manual Therapy  STM performed to left  extensor mass and brachioradialis with intermittent trigger point release; Left radial head anterior to posterior mobilizations at endrange elbow extension, grade 3, 30 seconds/bout x3 bouts; Left radial head posterior to anterior mobilizations at endrange elbow extension, grade 3, 30 seconds/bout x3 bouts;      Pt educated throughout session about proper posture and technique with exercises. Improved exercise technique, movement at target joints, use of target muscles after min to mod verbal, visual, tactile cues.      Patient arrives with excellent motivation to participate in therapy today. Due to report  of aggravated pain after previous two sessions, PT focused on pain mediation including moist heat, STM and exercises within pain-free ROM. STM was focused on brachioradialis and wrist extensors with one occurrence of radiating pain through radial nerve distribution with trigger point release in brachioradialis. Continued wrist strengthening exercises with addition of forearm sup/pro using red TB bar and progression in weight. Added hammer curl to HEP with emphasis on working through pain-free ROM (0-90*). Patient would benefit from continued PT services to address deficits in left lateral elbow pain in order to improve function at home and with leisure activities.            PT Short Term Goals - 05/19/21 2051       PT SHORT TERM GOAL #1   Title Pt will be independent with HEP in order to improve strength and decrease elbow pain in order to improve pain-free function at home and with leisure activities such as weight lifting    Time 4    Period Weeks    Status New    Target Date 06/15/21               PT Long Term Goals - 05/21/21 0922       PT LONG TERM GOAL #1   Title Pt will increase FOTO to at least 66 in order to demonstrate significant improvement in function related to L lateral elbow pain    Baseline 05/18/21: 56    Time 8    Period Weeks    Status New    Target Date 07/13/21      PT LONG TERM GOAL #2   Title Pt will report no further L lateral elbow pain with lifting groceries, lifting her dogs, and lifting weights during exercise in order to resume full function without pain    Baseline 05/18/21: Worst pain: 2/10    Time 8    Period Weeks    Status New    Target Date 07/13/21      PT LONG TERM GOAL #3   Title Pt will decrease quick DASH score by at least 8% in order to demonstrate clinically significant reduction in disability related to L lateral elbow pain    Baseline 05/18/21: to be completed at next visit; 05/20/21: 15.9%    Time 8    Period Weeks     Status New    Target Date 07/13/21                   Plan - 06/10/21 1705     Clinical Impression Statement Patient arrives with excellent motivation to participate in therapy today. Due to report of aggravated pain after previous two sessions, PT focused on pain mediation including moist heat, STM and exercises within pain-free ROM. STM was focused on brachioradialis and wrist extensors with one occurrence of radiating pain through radial nerve distribution with trigger point release in brachioradialis. Continued wrist strengthening exercises  with addition of forearm sup/pro using red TB bar and progression in weight. Added hammer curl to HEP with emphasis on working through pain-free ROM (0-90*). Patient would benefit from continued PT services to address deficits in left lateral elbow pain in order to improve function at home and with leisure activities.    Personal Factors and Comorbidities Comorbidity 1;Time since onset of injury/illness/exacerbation    Comorbidities Anxiety    Examination-Activity Limitations Lift    Examination-Participation Restrictions Community Activity;Shop    Stability/Clinical Decision Making Stable/Uncomplicated    Rehab Potential Excellent    PT Frequency 2x / week    PT Duration 8 weeks    PT Treatment/Interventions ADLs/Self Care Home Management;Aquatic Therapy;Biofeedback;Canalith Repostioning;Cryotherapy;Electrical Stimulation;Iontophoresis 4mg /ml Dexamethasone;Moist Heat;Traction;Ultrasound;Therapeutic activities;Therapeutic exercise;Balance training;Neuromuscular re-education;Orthotic Fit/Training;Manual techniques;Passive range of motion;Dry needling;Vestibular;Spinal Manipulations;Joint Manipulations    PT Next Visit Plan revisit moderate loading in isolated supinated elbow flexion, range limited; symptoms response; update HEP as needed.    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 [place], [place], [place] Phone: [phone]   Fax:  [phone]  Name: Emily Gordon MRN: Jonna Clark Date of Birth: 12-14-1980

## 2021-06-15 ENCOUNTER — Ambulatory Visit: Payer: 59

## 2021-06-15 ENCOUNTER — Other Ambulatory Visit: Payer: Self-pay

## 2021-06-15 DIAGNOSIS — M25522 Pain in left elbow: Secondary | ICD-10-CM | POA: Diagnosis not present

## 2021-06-15 NOTE — Therapy (Signed)
[place] [facility] [address] [place], [place], [address] Phone: [phone]   Fax:  [phone]  Physical Therapy Treatment  Patient Details  Name: Emily Gordon MRN: [id number] Date of Birth: 27-Jul-1981 Referring Provider (PT): Dr Ashley Royalty   Encounter Date: 06/15/2021   PT End of Session - 06/15/21 1636     Visit Number 9    Number of Visits 17    Date for PT Re-Evaluation 07/13/21    Authorization Type [employer] NAP    Authorization Time Period 05/18/21-07/13/21    PT Start Time 1615    PT Stop Time 1700    PT Time Calculation (min) 45 min    Activity Tolerance Patient tolerated treatment well    Behavior During Therapy [facility] for tasks assessed/performed             Past Medical History:  Diagnosis Date   Anxiety    Asthma    Ovarian cyst     Past Surgical History:  Procedure Laterality Date   BREAST LUMPECTOMY Right     There were no vitals filed for this visit.   Subjective Assessment - 06/15/21 1632     Subjective Pt reports she is well today. She is compliant with HEP. She denies any resting pain upon arrival. Overall pain has been improving but she still has some intermittent pain in the antecubital fossa.    Pertinent History Pt reports L lateral elbow pain x 6 months after an 8 hour car drive. She saw Dr. Judithann Graves on 03/25/21 who ordered plain film radiographs and referred her to Dr. Ashley Royalty. Elbow radiographs 03/25/21 showed no acute osseous injury of the left elbow. Independent interpretation by Dr. Ashley Royalty stated maintained articular surfaces, no cortical irregularities or roughening, no abnormal soft tissue shadow appreciated, no acute osseous process identified. He started her on Meloxicam and tennis elbow strap. He also provided her some stretches and strengthening exercises to perform at home. She followed up with him on 05/03/21 with significant improvement in her symptoms and was referred to PT. Sh still has not  returned to working out with weights due to the pain. No weakness reported in LUE. Denies N/T. No radiating pain up or down LUE. Pt has a history of carpal tunnel in LUE however it spontaneously resolved. She was also referred for cervical paraspinal spasm however upon arrival she denies any neck pain and states that she would like to focus on her elbow pain.    Patient Stated Goals Pt would like to return to lifting weights without an increase in her pain                TREATMENT     Ther-ex  Moist heat pack to L extensor mass during interval history x 5 minutes (unbilled); L wrist extensor stretch 3 x 45s; L wrist concentric/eccentric extension with 5# dumbbell x 20 with forearm supported on table; L wrist concentric/eccentric pronation and supination with hammer and 1# ankle weight attached to end x 20 with forearm supported on table; L wrist concentric/eccentric radial deviation with hammer and 1# ankle weight attached to end x 20 with forearm supported on table; L concentric/eccentric bicep flexion 7# dumbbell palm up, palm in, and palm down 2 x 10 each; Nautilus 60# lat pull downs (hand grips) 2 x 15; Nautilus 40# rows (hand grips) 2 x 15;    Manual Therapy  STM performed to left brachioradialis with intermittent trigger point release x 5 minutes;  Pt educated throughout session about proper posture and technique with exercises. Improved exercise technique, movement at target joints, use of target muscles after min to mod verbal, visual, tactile cues.      Patient arrives with excellent motivation to participate in therapy today.  No pain noted today to palpation of entire left lateral mass.  She only has mild pain with palpation just superior to antecubital fossa near the brachioradialis. Continued wrist strengthening exercises with addition of 1# dumbbell on hammer for radial deviation and pronation/supination. Utilized Nautilus for [employer] and continued with  left elbow flexor strengthening in palm up, palm in and, palm down positions using 7 pound dumbbell.  Patient will need updated outcome measures/goals at next session in addition to a progress note.  Patient would benefit from continued PT services to address deficits in left lateral elbow pain in order to improve function at home and with leisure activities.            PT Short Term Goals - 05/19/21 2051       PT SHORT TERM GOAL #1   Title Pt will be independent with HEP in order to improve strength and decrease elbow pain in order to improve pain-free function at home and with leisure activities such as weight lifting    Time 4    Period Weeks    Status New    Target Date 06/15/21               PT Long Term Goals - 05/21/21 0922       PT LONG TERM GOAL #1   Title Pt will increase FOTO to at least 66 in order to demonstrate significant improvement in function related to L lateral elbow pain    Baseline 05/18/21: 56    Time 8    Period Weeks    Status New    Target Date 07/13/21      PT LONG TERM GOAL #2   Title Pt will report no further L lateral elbow pain with lifting groceries, lifting her dogs, and lifting weights during exercise in order to resume full function without pain    Baseline 05/18/21: Worst pain: 2/10    Time 8    Period Weeks    Status New    Target Date 07/13/21      PT LONG TERM GOAL #3   Title Pt will decrease quick DASH score by at least 8% in order to demonstrate clinically significant reduction in disability related to L lateral elbow pain    Baseline 05/18/21: to be completed at next visit; 05/20/21: 15.9%    Time 8    Period Weeks    Status New    Target Date 07/13/21                   Plan - 06/15/21 1636     Clinical Impression Statement Patient arrives with excellent motivation to participate in therapy today.  No pain noted today to palpation of entire left lateral mass.  She only has mild pain with palpation just superior  to antecubital fossa near the brachioradialis. Continued wrist strengthening exercises with addition of 1# dumbbell on hammer for radial deviation and pronation/supination. Utilized Nautilus for [employer] and continued with left elbow flexor strengthening in palm up, palm in and, palm down positions using 7 pound dumbbell.  Patient will need updated outcome measures/goals at next session in addition to a progress note.  Patient would benefit from continued PT  services to address deficits in left lateral elbow pain in order to improve function at home and with leisure activities.    Personal Factors and Comorbidities Comorbidity 1;Time since onset of injury/illness/exacerbation    Comorbidities Anxiety    Examination-Activity Limitations Lift    Examination-Participation Restrictions Community Activity;Shop    Stability/Clinical Decision Making Stable/Uncomplicated    Rehab Potential Excellent    PT Frequency 2x / week    PT Duration 8 weeks    PT Treatment/Interventions ADLs/Self Care Home Management;Aquatic Therapy;Biofeedback;Canalith Repostioning;Cryotherapy;Electrical Stimulation;Iontophoresis 4mg /ml Dexamethasone;Moist Heat;Traction;Ultrasound;Therapeutic activities;Therapeutic exercise;Balance training;Neuromuscular re-education;Orthotic Fit/Training;Manual techniques;Passive range of motion;Dry needling;Vestibular;Spinal Manipulations;Joint Manipulations    PT Next Visit Plan Continue with strengthening, manual techniques as needed, update HEP as needed.    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 [place], [place], [place] Phone: [phone]   Fax:  [phone]  Name: Emily Gordon MRN: Jonna Clark Date of Birth: October 26, 1980

## 2021-06-17 ENCOUNTER — Ambulatory Visit: Payer: 59

## 2021-06-17 ENCOUNTER — Other Ambulatory Visit: Payer: Self-pay

## 2021-06-17 ENCOUNTER — Other Ambulatory Visit: Payer: Self-pay | Admitting: Family Medicine

## 2021-06-17 ENCOUNTER — Other Ambulatory Visit: Payer: Self-pay | Admitting: Internal Medicine

## 2021-06-17 ENCOUNTER — Encounter: Payer: Self-pay | Admitting: Family Medicine

## 2021-06-17 DIAGNOSIS — M7712 Lateral epicondylitis, left elbow: Secondary | ICD-10-CM

## 2021-06-17 DIAGNOSIS — M25522 Pain in left elbow: Secondary | ICD-10-CM

## 2021-06-17 DIAGNOSIS — R748 Abnormal levels of other serum enzymes: Secondary | ICD-10-CM

## 2021-06-17 NOTE — Therapy (Signed)
[place] [facility] [address] [place], [place], [address] Phone: [phone]   Fax:  [phone]  Physical Therapy Progress Note  Dates of reporting period  05/18/21   to   06/17/21  Patient Details  Name: Emily Gordon MRN: [id number] Date of Birth: 1981/08/19 Referring Provider (PT): Dr Zigmund Daniel   Encounter Date: 06/17/2021   PT End of Session - 06/17/21 1614     Visit Number 10    Number of Visits 17    Date for PT Re-Evaluation 07/13/21    Authorization Type [employer] NAP    Authorization Time Period 05/18/21-07/13/21    PT Start Time 1615    PT Stop Time 1700    PT Time Calculation (min) 45 min    Activity Tolerance Patient tolerated treatment well    Behavior During Therapy [facility] for tasks assessed/performed             Past Medical History:  Diagnosis Date   Anxiety    Asthma    Ovarian cyst     Past Surgical History:  Procedure Laterality Date   BREAST LUMPECTOMY Right     There were no vitals filed for this visit.   Subjective Assessment - 06/17/21 1613     Subjective Pt reports she is doing alright today. She has been having more pain in her antecubital fossa recently and it has feeled particularly aggravated over the last couple days.  She contacted Dr. Zigmund Daniel and asked if he would order an MRI for her left elbow.  She is compliant with HEP. She denies any resting pain upon arrival.    Pertinent History Pt reports L lateral elbow pain x 6 months after an 8 hour car drive. She saw Dr. Army Melia on 03/25/21 who ordered plain film radiographs and referred her to Dr. Zigmund Daniel. Elbow radiographs 03/25/21 showed no acute osseous injury of the left elbow. Independent interpretation by Dr. Zigmund Daniel stated maintained articular surfaces, no cortical irregularities or roughening, no abnormal soft tissue shadow appreciated, no acute osseous process identified. He started her on Meloxicam and tennis elbow strap. He also provided  her some stretches and strengthening exercises to perform at home. She followed up with him on 05/03/21 with significant improvement in her symptoms and was referred to PT. Sh still has not returned to working out with weights due to the pain. No weakness reported in LUE. Denies N/T. No radiating pain up or down LUE. Pt has a history of carpal tunnel in LUE however it spontaneously resolved. She was also referred for cervical paraspinal spasm however upon arrival she denies any neck pain and states that she would like to focus on her elbow pain.    Patient Stated Goals Pt would like to return to lifting weights without an increase in her pain                TREATMENT     Ther-ex  UBE x 4 minutes for warm-up during interval history (2 minutes forward/2 minutes backwards); Moist heat pack to L extensor mass and antecubital fossa during interval history x 5 minutes (2 minutes unbilled); Updated outcome measures with patient: QuickDASH: 27.3% NDI: 34% FOTO: 60 Worst L elbow pain: 4/10;  L wrist extensor stretch with straight elbow 2 x 45s; L wrist concentric/eccentric extension with 6# dumbbell x 15 with forearm supported on table, no pain; L wrist concentric/eccentric pronation and supination with 6# dumbbell (holding at one end) x 15 with forearm supported on  table, no pain; L wrist concentric/eccentric radial deviation with 6# dumbbell (holding at one end) x 15 with forearm supported on table;    Manual Therapy  STM performed to left brachioradialis with intermittent trigger point release x 5 minutes, pt does have a small area that is swollen in her antecubital fossa. Difficult to determine exact site as active/passive ROM and isometric strength testing is inconsistently painful.     Pt educated throughout session about proper posture and technique with exercises. Improved exercise technique, movement at target joints, use of target muscles after min to mod verbal, visual, tactile  cues.      Patient arrives with excellent motivation to participate in therapy today.  Updated outcome measures and goals with patient today.  Patient has achieved complete resolution of pain with palpation in the entire lateral mass/wrist extensors of her left forearm. She does report a very mild pain with resisted wrist extension and resisted third MCP extension. However she is now reporting progressively increasing pain in the antecubital fossa just lateral to midline. It is difficult to determine the exact site of her pain as active/passive ROM and isometric strength testing is inconsistently painful. It appears most likely to be related to the brachioradialis or brachialis muscles however she does report some tenderness to palpation of her L bicep muscle. Per therapist instruction pt had tried to utilize her tennis elbow strap however she reports that this was making her anterior elbow pain worse. Her FOTO score improved from 56 to 60 today. However her QuickDASH worsened from 15.9% to 27.3% related to her worsening anterior elbow pain. Continued wrist strengthening exercises with increased resistance and no reports of pain by patient. Patient encouraged to discontinue elbow flexion strengthening that was initiated during last therapy session in case that is aggravating her anterior elbow pain. Patient reports that she is ready to start addressing her neck pain so she completed a NDI today which revealed 34% self-reported disability. Therapist will perform additional cervical examination at next visit. Pt will be on vacation for the entirety of next week.  Patient also reports that she had another bout of dizziness yesterday that lasted for approximately 1-1/2 hours and was associated with a "tension headache" and heart palpitations.  She reports that this has been worked up by cardiology in the past without any significant findings.  Patient encouraged to follow-up with her primary care physician regarding  these symptoms. Patient would benefit from continued PT services to address deficits in left lateral elbow pain in order to improve function at home and with leisure activities.            PT Short Term Goals - 06/17/21 1614       PT SHORT TERM GOAL #1   Title Pt will be independent with HEP in order to improve strength and decrease elbow pain in order to improve pain-free function at home and with leisure activities such as weight lifting    Time 4    Period Weeks    Status Partially Met    Target Date 06/15/21               PT Long Term Goals - 06/17/21 1615       PT LONG TERM GOAL #1   Title Pt will increase FOTO to at least 66 in order to demonstrate significant improvement in function related to L lateral elbow pain    Baseline 05/18/21: 56; 06/18/21: 60    Time 8    Period  Weeks    Status Partially Met    Target Date 07/13/21      PT LONG TERM GOAL #2   Title Pt will report no further L lateral elbow pain with lifting groceries, lifting her dogs, and lifting weights during exercise in order to resume full function without pain    Baseline 05/18/21: Worst pain: 2/10; 06/17/21: 4/10;    Time 8    Period Weeks    Status On-going    Target Date 07/13/21      PT LONG TERM GOAL #3   Title Pt will decrease quick DASH score by at least 8% in order to demonstrate clinically significant reduction in disability related to L lateral elbow pain    Baseline 05/18/21: to be completed at next visit; 05/20/21: 15.9%; 06/17/21: 27.3%    Time 8    Period Weeks    Status On-going    Target Date 07/13/21      PT LONG TERM GOAL #4   Title Pt will demonstrate decrease in NDI by at least 19% in order to demonstrate clinically significant reduction in disability related to neck injury/pain    Baseline 06/17/21: 34%    Time 8    Period Weeks    Status New    Target Date 07/13/21                   Plan - 06/17/21 1614     Clinical Impression Statement Patient  arrives with excellent motivation to participate in therapy today.  Updated outcome measures and goals with patient today.  Patient has achieved complete resolution of pain with palpation in the entire lateral mass/wrist extensors of her left forearm. She does report a very mild pain with resisted wrist extension and resisted third MCP extension. However she is now reporting progressively increasing pain in the antecubital fossa just lateral to midline. It is difficult to determine the exact site of her pain as active/passive ROM and isometric strength testing is inconsistently painful. It appears most likely to be related to the brachioradialis or brachialis muscles however she does report some tenderness to palpation of her L bicep muscle. Per therapist instruction pt had tried to utilize her tennis elbow strap however she reports that this was making her anterior elbow pain worse. Her FOTO score improved from 56 to 60 today. However her QuickDASH worsened from 15.9% to 27.3% related to her worsening anterior elbow pain. Continued wrist strengthening exercises with increased resistance and no reports of pain by patient. Patient encouraged to discontinue elbow flexion strengthening that was initiated during last therapy session in case that is aggravating her anterior elbow pain. Patient reports that she is ready to start addressing her neck pain so she completed a NDI today which revealed 34% self-reported disability. Therapist will perform additional cervical examination at next visit. Pt will be on vacation for the entirety of next week.  Patient also reports that she had another bout of dizziness yesterday that lasted for approximately 1-1/2 hours and was associated with a "tension headache" and heart palpitations.  She reports that this has been worked up by cardiology in the past without any significant findings.  Patient encouraged to follow-up with her primary care physician regarding these symptoms.  Patient would benefit from continued PT services to address deficits in left lateral elbow pain in order to improve function at home and with leisure activities.    Personal Factors and Comorbidities Comorbidity 1;Time since onset of injury/illness/exacerbation  Comorbidities Anxiety    Examination-Activity Limitations Lift    Examination-Participation Restrictions Community Activity;Shop    Stability/Clinical Decision Making Stable/Uncomplicated    Rehab Potential Excellent    PT Frequency 2x / week    PT Duration 8 weeks    PT Treatment/Interventions ADLs/Self Care Home Management;Aquatic Therapy;Biofeedback;Canalith Repostioning;Cryotherapy;Electrical Stimulation;Iontophoresis 68m/ml Dexamethasone;Moist Heat;Traction;Ultrasound;Therapeutic activities;Therapeutic exercise;Balance training;Neuromuscular re-education;Orthotic Fit/Training;Manual techniques;Passive range of motion;Dry needling;Vestibular;Spinal Manipulations;Joint Manipulations    PT Next Visit Plan Cervical examination, continue with strengthening, manual techniques as needed, update HEP as needed.    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

## 2021-06-19 LAB — HEPATIC FUNCTION PANEL
ALT: 54 IU/L — ABNORMAL HIGH (ref 0–32)
AST: 37 IU/L (ref 0–40)
Albumin: 4.9 g/dL — ABNORMAL HIGH (ref 3.8–4.8)
Alkaline Phosphatase: 117 IU/L (ref 44–121)
Bilirubin Total: 0.3 mg/dL (ref 0.0–1.2)
Bilirubin, Direct: <0.1 mg/dL (ref 0.00–0.40)
Total Protein: 7 g/dL (ref 6.0–8.5)

## 2021-06-29 ENCOUNTER — Other Ambulatory Visit: Payer: Self-pay

## 2021-06-29 ENCOUNTER — Ambulatory Visit: Payer: 59

## 2021-06-29 DIAGNOSIS — M25522 Pain in left elbow: Secondary | ICD-10-CM

## 2021-06-29 NOTE — Therapy (Signed)
[place] [facility] [address] [place], [place], [address] Phone: [phone]   Fax:  [phone]  Physical Therapy Treatment  Patient Details  Name: Emily Gordon MRN: [id number] Date of Birth: 1981-02-13 Referring Provider (PT): Dr Zigmund Daniel   Encounter Date: 06/29/2021   PT End of Session - 06/29/21 1629     Visit Number 11    Number of Visits 17    Date for PT Re-Evaluation 07/13/21    Authorization Type [employer] NAP    Authorization Time Period 05/18/21-07/13/21    Progress Note Due on Visit 20    PT Start Time 1620    PT Stop Time 1658    PT Time Calculation (min) 38 min    Activity Tolerance Patient tolerated treatment well    Behavior During Therapy [facility] for tasks assessed/performed             Past Medical History:  Diagnosis Date   Anxiety    Asthma    Ovarian cyst     Past Surgical History:  Procedure Laterality Date   BREAST LUMPECTOMY Right     There were no vitals filed for this visit.   Subjective Assessment - 06/29/21 1626     Subjective Pt doing well. Still having same pain. No pain issues if she avoids using limb. HEP coming along nicely.    Pertinent History Pt reports L lateral elbow pain x 6 months after an 8 hour car drive. She saw Dr. Army Melia on 03/25/21 who ordered plain film radiographs and referred her to Dr. Zigmund Daniel. Elbow radiographs 03/25/21 showed no acute osseous injury of the left elbow. Independent interpretation by Dr. Zigmund Daniel stated maintained articular surfaces, no cortical irregularities or roughening, no abnormal soft tissue shadow appreciated, no acute osseous process identified. He started her on Meloxicam and tennis elbow strap. He also provided her some stretches and strengthening exercises to perform at home. She followed up with him on 05/03/21 with significant improvement in her symptoms and was referred to PT. Sh still has not returned to working out with weights due to the pain. No  weakness reported in LUE. Denies N/T. No radiating pain up or down LUE. Pt has a history of carpal tunnel in LUE however it spontaneously resolved. She was also referred for cervical paraspinal spasm however upon arrival she denies any neck pain and states that she would like to focus on her elbow pain.    Patient Stated Goals Pt would like to return to lifting weights without an increase in her pain    Currently in Pain? No/denies             INTERVENTION  -UBE WARMUP, 60sec CW, 60 CCW  -Supinated elbow flexion 1x12 @ 9lb  -LUE locked elbow extension, supination with key grip, 10lb cable resistance  -LUE tricep extension 20lb cable resistance 3x10 -LUE GHJ external rotation start from belly 1x8 @ 20lb, 1x12 @ 15lb  -BUE shoulder ABDCT 6lb bilat 1x15   -supine Left radial head dorsal glide in elbow extension grade 4, 2x30sec -ART brachioradialis, upper elbow flexors; has focal pain with palaption of triceps border as well     The patient's therapy prognosis indicates continued potential for improvement, anticipate that future progress is attainable in a reasonable/predictable timeframe. Maximum improvement is within reach. Pt will continue to benefit from skilled PT services to address deficits and impairment identified in evaluation in order to maximize independence and safety in basic mobility required for performance of  ADL, IADL, and leisure.     PT Education - 06/29/21 1629     Education Details HEP technique as needed              PT Short Term Goals - 06/17/21 1614       PT SHORT TERM GOAL #1   Title Pt will be independent with HEP in order to improve strength and decrease elbow pain in order to improve pain-free function at home and with leisure activities such as weight lifting    Time 4    Period Weeks    Status Partially Met    Target Date 06/15/21               PT Long Term Goals - 06/17/21 1615       PT LONG TERM GOAL #1   Title Pt will increase  FOTO to at least 66 in order to demonstrate significant improvement in function related to L lateral elbow pain    Baseline 05/18/21: 56; 06/18/21: 60    Time 8    Period Weeks    Status Partially Met    Target Date 07/13/21      PT LONG TERM GOAL #2   Title Pt will report no further L lateral elbow pain with lifting groceries, lifting her dogs, and lifting weights during exercise in order to resume full function without pain    Baseline 05/18/21: Worst pain: 2/10; 06/17/21: 4/10;    Time 8    Period Weeks    Status On-going    Target Date 07/13/21      PT LONG TERM GOAL #3   Title Pt will decrease quick DASH score by at least 8% in order to demonstrate clinically significant reduction in disability related to L lateral elbow pain    Baseline 05/18/21: to be completed at next visit; 05/20/21: 15.9%; 06/17/21: 27.3%    Time 8    Period Weeks    Status On-going    Target Date 07/13/21      PT LONG TERM GOAL #4   Title Pt will demonstrate decrease in NDI by at least 19% in order to demonstrate clinically significant reduction in disability related to neck injury/pain    Baseline 06/17/21: 34%    Time 8    Period Weeks    Status New    Target Date 07/13/21                   Plan - 06/29/21 1651     Clinical Impression Statement Continued with current plan of care as laid out in evaluation and recent prior sessions. Still has pain with end range passive supination, espeically in full extension, focal to radial head. All interventions tolerated as expected by author. Moderate loading performed and tolerated well. If kept away from end range elbow extension. Mobility and strength continue to progress as anticipated. Recovery intervals given as needed based on signs of exertion and/or pt request. Soreness and pain mildly increased at end of session, however as expected given period of disuse. Pt educated on best technique for each intervention- author uses verbal, visual, tactile cues  to optimize learning. Author takes steps to maximize patient independence when appropriate. Pt remains highly motivated.     Personal Factors and Comorbidities Comorbidity 1;Time since onset of injury/illness/exacerbation    Comorbidities Anxiety    Examination-Activity Limitations Lift    Examination-Participation Restrictions Community Activity;Shop    Stability/Clinical Decision Making Stable/Uncomplicated    Clinical Decision  Making High    Rehab Potential Excellent    PT Frequency 2x / week    PT Duration 8 weeks    PT Treatment/Interventions ADLs/Self Care Home Management;Aquatic Therapy;Biofeedback;Canalith Repostioning;Cryotherapy;Electrical Stimulation;Iontophoresis 51m/ml Dexamethasone;Moist Heat;Traction;Ultrasound;Therapeutic activities;Therapeutic exercise;Balance training;Neuromuscular re-education;Orthotic Fit/Training;Manual techniques;Passive range of motion;Dry needling;Vestibular;Spinal Manipulations;Joint Manipulations    PT Next Visit Plan Cervical examination, continue with strengthening, manual techniques as needed, update HEP as needed.    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 [place] [address]Phone: [phone]  Fax:  [phone] Name: HEmory LeaverMRN: [id number] of Birth: 129-Sep-1982

## 2021-07-01 ENCOUNTER — Ambulatory Visit: Payer: 59

## 2021-07-01 ENCOUNTER — Other Ambulatory Visit: Payer: Self-pay

## 2021-07-01 DIAGNOSIS — M25522 Pain in left elbow: Secondary | ICD-10-CM

## 2021-07-01 NOTE — Therapy (Signed)
[facility] [address] [place], [place], [address] Phone: [phone]   Fax:  [phone]  Physical Therapy Treatment  Patient Details  Name: Emily Gordon MRN: [id number] Date of Birth: 08/18/1981 Referring Provider (PT): Dr Zigmund Daniel   Encounter Date: 07/01/2021   PT End of Session - 07/02/21 1416     Visit Number 12    Number of Visits 17    Date for PT Re-Evaluation 07/13/21    Authorization Type Aet[employer] NAP    Authorization Time Period 05/18/21-07/13/21    Progress Note Due on Visit 20    PT Start Time 1617    PT Stop Time 1700    PT Time Calculation (min) 43 min    Activity Tolerance Patient tolerated treatment well    Behavior During Therapy [facility] for tasks assessed/performed             Past Medical History:  Diagnosis Date   Anxiety    Asthma    Ovarian cyst     Past Surgical History:  Procedure Laterality Date   BREAST LUMPECTOMY Right     There were no vitals filed for this visit.   Subjective Assessment - 07/01/21 1620     Subjective Pt doing well. Lateral elbow pain has resolved but still has some pain on the inside of the elbow near the antecubital fossa just medial to the extensor mass. Insurance denied MRI of her elbow. She would like to assess her neck pain today.    Pertinent History Pt reports L lateral elbow pain x 6 months after an 8 hour car drive. She saw Dr. Army Melia on 03/25/21 who ordered plain film radiographs and referred her to Dr. Zigmund Daniel. Elbow radiographs 03/25/21 showed no acute osseous injury of the left elbow. Independent interpretation by Dr. Zigmund Daniel stated maintained articular surfaces, no cortical irregularities or roughening, no abnormal soft tissue shadow appreciated, no acute osseous process identified. He started her on Meloxicam and tennis elbow strap. He also provided her some stretches and strengthening exercises to perform at home. She followed up with him on 05/03/21 with  significant improvement in her symptoms and was referred to PT. She still has not returned to working out with weights due to the pain. No weakness reported in LUE. Denies N/T. No radiating pain up or down LUE. Pt has a history of carpal tunnel in LUE however it spontaneously resolved. She was also referred for cervical paraspinal spasm however upon arrival she denies any neck pain and states that she would like to focus on her elbow pain. 07/01/21 Neck pain:  R sided posterior neck pain for multiple years. No trauma at onset or previous history of injury. Pain worsens with exercising and extended sitting. Possible history of torticollis as an infant. A few years back (possibly 5 years) she had a NCV study but is unsure of the results. She complains of regular headaches related to the pain. Cervical radiographs 05/03/21 showed straightening of the cervical spine with possible left foraminal narrowing C3 through C5. Otherwise negative.    Limitations Lifting    Diagnostic tests See history    Patient Stated Goals Pt would like to return to lifting weights without an increase in her pain    Currently in Pain? Yes    Pain Score 4    Worst: 6/10   Pain Location Neck    Pain Orientation Right;Posterior;Upper    Pain Descriptors / Indicators Tightness   "tension"   Pain Type  Chronic pain    Pain Radiating Towards None    Pain Onset More than a month ago    Pain Frequency Intermittent    Aggravating Factors  see history    Pain Relieving Factors see history    Effect of Pain on Daily Activities Limits her ability to exercise and causes headaches               TREATMENT   Manual Therapy    SUBJECTIVE  Onset: R sided posterior neck pain for multiple years. No trauma at onset or previous history of injury. Pain worsens with exercising and extended sitting. Possible history of torticollis as an infant. A few years back (possibly 5 years) she had a NCV study but is unsure of the results. She  complains of regular headaches related to the pain. Cervical radiographs 05/03/21 showed straightening of the cervical spine with possible left foraminal narrowing C3 through C5. Otherwise negative. Pain: 3-4/10 Present, 0/10 Best, 6/10 Worst; Aggravating factors: exercising, extended sitting, moving arms Easing factors: support pillow, heat  Recent neck trauma: No Prior history of neck injury or pain: No Pain quality: tension Radiating pain: No  Numbness/Tingling: Yes, she reports some numbness in the R suboccipital area Follow-up appointment with MD: No Dominant hand: right Imaging: Yes, see history;     OBJECTIVE  Mental Status Patient is oriented to person, place and time.  Recent memory is intact.  Remote memory is intact.  Attention span and concentration are intact.  Expressive speech is intact.  Patient's fund of knowledge is within normal limits for educational level.  SENSATION: Grossly intact to light touch bilateral UE as determined by testing dermatomes C2-T2. Proprioception and hot/cold testing deferred on this date   MUSCULOSKELETAL: Tremor: None Bulk: Normal Tone: Normal  Posture No gross deficits in sitting or standing posture    Palpation Pain to palpation in R suboccipitals.    Strength R/L 5/5 Shoulder flexion (anterior deltoid/pec major/coracobrachialis, axillary n. (C5/6) and musculocutaneous n. (C5-7)) 5/5 Shoulder abduction (deltoid/supraspinatus, axillary/suprascapular n, C5) 5/5 Shoulder external rotation (infraspinatus/teres minor) 5/5 Shoulder internal rotation (subcapularis/lats/pec major) 5/5 Shoulder extension (posterior deltoid, lats, teres major, axillary/thoracodorsal n.) 5/5 Shoulder horizontal abduction 5/5 Elbow flexion (biceps brachii, brachialis, brachioradialis, musculoskeletal n, C5/6) 5/5 Elbow extension (triceps, radial n, C7) 5/5 Wrist Extension (C6/7) 5/5 Wrist Flexion (C6/7) 5/5 Finger adduction (interossei, ulnar n,  T1) Cervical isometrics are strong and painless in all directions;  AROM R/L 60 Cervical Flexion 50 Cervical Extension 45/55 Cervical Lateral Flexion 70/65 Cervical Rotation *Indicates pain, overpressure performed unless otherwise indicated  Repeated Movements No centralization or peripheralization of symptoms with repeated cervical protraction and retraction.    Muscle Length Upper Trap: WNL Levator: WNL Scalenes: WNL   Passive Accessory Intervertebral Motion (PAIVM) Pt denies reproduction of neck pain with CPA C2-T7 and UPA bilaterally C2-T7. Generally normal mobility throughout;  Reflex Testing Deferred  SPECIAL TESTS Spurlings A (ipsilateral lateral flexion/axial compression): R: Negative L: Negative Spurlings B (ipsilateral lateral flexion/contralateral rotation/axial compression): R: Negative L: Negative Distraction Test: Not examined  Hoffman Sign (cervical cord compression): R: Negative  L: Positive ULTT Median: R: Not examined L: Not examined ULTT Ulnar: R: Not examined L: Not examined ULTT Radial: R: Not examined L: Not examined  R UPA mobilizations at C2, grade II-III, 20s/bout x 2 bouts;   Trigger Point Dry Needling (TDN), unbilled Education previously performed with patient regarding potential benefit of TDN. Previously reviewed precautions and risks with patient. Extensive time spent with  pt to ensure full understanding of TDN risks. Pt provided verbal consent to treatment. Using clean technique in prone TDN performed to R suboccipitals with 2,0.25 x 40 single needle placements with local twitch response (LTR) as identified by deep ache. Pistoning technique utilized. No worsening of pain afterwards.     Pt would like to assess her neck pain during session today. Evaluation is relatively unremarkable with the exception of pain to palpation along R suboccipitals. She also presents with limited cervical extension and bilateral rotation AROM but it is not painful  and appears grossly symmetrical. Utilized trigger point dry needling today for R suboccipitals to assess response. Pt instructed in R upper trap, levator, and scalene stretches. Pt encouraged to continue her elbow HEP, add stretches, and follow-up as scheduled. Pt will benefit from PT services to address deficits in neck and elbow pain in order to return to full function at home and work with less pain.            PT Short Term Goals - 06/17/21 1614       PT SHORT TERM GOAL #1   Title Pt will be independent with HEP in order to improve strength and decrease elbow pain in order to improve pain-free function at home and with leisure activities such as weight lifting    Time 4    Period Weeks    Status Partially Met    Target Date 06/15/21               PT Long Term Goals - 07/02/21 1436       PT LONG TERM GOAL #1   Title Pt will increase FOTO to at least 66 in order to demonstrate significant improvement in function related to L lateral elbow pain    Baseline 05/18/21: 56; 06/18/21: 60    Time 8    Period Weeks    Status Partially Met    Target Date 07/13/21      PT LONG TERM GOAL #2   Title Pt will report no further L lateral elbow pain with lifting groceries, lifting her dogs, and lifting weights during exercise in order to resume full function without pain    Baseline 05/18/21: Worst pain: 2/10; 06/17/21: 4/10;    Time 8    Period Weeks    Status On-going    Target Date 07/13/21      PT LONG TERM GOAL #3   Title Pt will decrease quick DASH score by at least 8% in order to demonstrate clinically significant reduction in disability related to L lateral elbow pain    Baseline 05/18/21: to be completed at next visit; 05/20/21: 15.9%; 06/17/21: 27.3%    Time 8    Period Weeks    Status On-going    Target Date 07/13/21      PT LONG TERM GOAL #4   Title Pt will demonstrate decrease in NDI by at least 19% in order to demonstrate clinically significant reduction in  disability related to neck injury/pain    Baseline 06/17/21: 34%    Time 8    Period Weeks    Status New    Target Date 07/13/21      PT LONG TERM GOAL #5   Title Pt will decrease worst neck pain as reported on NPRS by at least 2 points in order to demonstrate clinically significant reduction in neck pain.    Baseline 07/01/21: worst neck pain: 6/10;    Time 8    Period  Weeks    Status New    Target Date 07/13/21                   Plan - 07/02/21 1417     Clinical Impression Statement Pt would like to assess her neck pain during session today. Evaluation is relatively unremarkable with the exception of pain to palpation along R suboccipitals. She also presents with limited cervical extension and bilateral rotation AROM but it is not painful and appears grossly symmetrical. Utilized trigger point dry needling today for R suboccipitals to assess response. Pt instructed in R upper trap, levator, and scalene stretches. Pt encouraged to continue her elbow HEP, add stretches, and follow-up as scheduled. Pt will benefit from PT services to address deficits in neck and elbow pain in order to return to full function at home and work with less pain.    Personal Factors and Comorbidities Comorbidity 1;Time since onset of injury/illness/exacerbation    Comorbidities Anxiety    Examination-Activity Limitations Lift    Examination-Participation Restrictions Community Activity;Shop    Stability/Clinical Decision Making Stable/Uncomplicated    Rehab Potential Excellent    PT Frequency 2x / week    PT Duration 8 weeks    PT Treatment/Interventions ADLs/Self Care Home Management;Aquatic Therapy;Biofeedback;Canalith Repostioning;Cryotherapy;Electrical Stimulation;Iontophoresis 51m/ml Dexamethasone;Moist Heat;Traction;Ultrasound;Therapeutic activities;Therapeutic exercise;Balance training;Neuromuscular re-education;Orthotic Fit/Training;Manual techniques;Passive range of motion;Dry  needling;Vestibular;Spinal Manipulations;Joint Manipulations    PT Next Visit Plan continue with strengthening, manual techniques as needed, update HEP with cervical stretches.    PT Home Exercise Plan Access Code: [email or web address] (also added resisted elbow flexion with palm up, palm in, and palm down)    Consulted and Agree with Plan of Care Patient             Patient will benefit from skilled therapeutic intervention in order to improve the following deficits and impairments:  Pain, Decreased range of motion  Visit Diagnosis: Pain in left elbow     Problem List Patient Active Problem List   Diagnosis Date Noted   Foraminal stenosis of cervical region 06/07/2021   Cervical paraspinal muscle spasm 05/03/2021   Other fatigue 04/07/2021   Tachycardia 04/07/2021   Vertigo 04/07/2021   Lateral epicondylitis of left elbow 04/05/2021   Depression with anxiety 10/19/2020   JPhillips GroutPT, DPT, GCS  Jex Strausbaugh, PT 07/02/2021, 2:42 PM  [place] [facility] [place] [address]Phone: [phone]  Fax:  [phone] Name: HDanyka MerlinMRN: [id number] of Birth: 1February 14, 1982

## 2021-07-05 NOTE — Telephone Encounter (Signed)
Sent message to Retta Diones to follow up on MRI elbow.  Pending response.

## 2021-07-06 ENCOUNTER — Other Ambulatory Visit: Payer: Self-pay

## 2021-07-06 ENCOUNTER — Ambulatory Visit: Payer: 59 | Attending: Family Medicine

## 2021-07-06 DIAGNOSIS — M542 Cervicalgia: Secondary | ICD-10-CM | POA: Insufficient documentation

## 2021-07-06 DIAGNOSIS — M25522 Pain in left elbow: Secondary | ICD-10-CM | POA: Diagnosis present

## 2021-07-06 NOTE — Therapy (Signed)
[place] [facility] [address] [place], [place], [address] Phone: [phone]   Fax:  [phone]  Physical Therapy Treatment  Patient Details  Name: Emily Gordon MRN: [id number] Date of Birth: Dec 18, 1980 Referring Provider (PT): Dr Zigmund Daniel   Encounter Date: 07/06/2021   PT End of Session - 07/06/21 1625     Visit Number 13    Number of Visits 17    Date for PT Re-Evaluation 07/13/21    Authorization Type [employer] NAP    Authorization Time Period 05/18/21-07/13/21    Progress Note Due on Visit 20    PT Start Time 1615    PT Stop Time 1700    PT Time Calculation (min) 45 min    Activity Tolerance Patient tolerated treatment well    Behavior During Therapy [facility] for tasks assessed/performed             Past Medical History:  Diagnosis Date   Anxiety    Asthma    Ovarian cyst     Past Surgical History:  Procedure Laterality Date   BREAST LUMPECTOMY Right     There were no vitals filed for this visit.   Subjective Assessment - 07/06/21 1621     Subjective Pt doing well. Lateral elbow pain continues to do well and the last couple days her anterior elbow pain has been doing alright. She had a bad headache the day after her last therapy session. Unsure if dry needling helped after last session. She is also reporting some bilateral upper trap pain upon arrival and has a headache which she rates as 3/10. Insurance approved the MRI of her elbow but she has to have it done in [place].    Pertinent History Pt reports L lateral elbow pain x 6 months after an 8 hour car drive. She saw Dr. Army Melia on 03/25/21 who ordered plain film radiographs and referred her to Dr. Zigmund Daniel. Elbow radiographs 03/25/21 showed no acute osseous injury of the left elbow. Independent interpretation by Dr. Zigmund Daniel stated maintained articular surfaces, no cortical irregularities or roughening, no abnormal soft tissue shadow appreciated, no acute osseous process  identified. He started her on Meloxicam and tennis elbow strap. He also provided her some stretches and strengthening exercises to perform at home. She followed up with him on 05/03/21 with significant improvement in her symptoms and was referred to PT. She still has not returned to working out with weights due to the pain. No weakness reported in LUE. Denies N/T. No radiating pain up or down LUE. Pt has a history of carpal tunnel in LUE however it spontaneously resolved. She was also referred for cervical paraspinal spasm however upon arrival she denies any neck pain and states that she would like to focus on her elbow pain. 07/01/21 Neck pain:  R sided posterior neck pain for multiple years. No trauma at onset or previous history of injury. Pain worsens with exercising and extended sitting. Possible history of torticollis as an infant. A few years back (possibly 5 years) she had a NCV study but is unsure of the results. She complains of regular headaches related to the pain. Cervical radiographs 05/03/21 showed straightening of the cervical spine with possible left foraminal narrowing C3 through C5. Otherwise negative.    Limitations Lifting    Diagnostic tests See history    Patient Stated Goals Pt would like to return to lifting weights without an increase in her pain  TREATMENT   Manual Therapy  Moist heat pack applied to posterior cervical paraspinals with patient in prone during interval history prior to stretching to improve tissue extensibility, no skin irritation noted; Prone C2-C4 grade III CPA mobilizations, 30s/bout x 2 bouts/level; Prone C2-C4 grade III R UPA mobilizations, 30s/bout x 2 bouts/level; Prone C1 R lateral mass PA mobilizations grade III, 30s/bout x 2 bouts; Prone STM to R suboccipitals, cervical paraspinals, and upper trap with intermittent trigger point long hold release; Supine R upper trap, lateral flexion, and levator stretches x 30s each; HEP  reinforced;    Trigger Point Dry Needling (TDN), unbilled Education previously performed with patient regarding potential benefit of TDN. Previously reviewed precautions and risks with patient. Extensive time spent with pt to ensure full understanding of TDN risks. Pt provided verbal consent to treatment. Using clean technique in prone TDN performed to R suboccipitals with 2, 0.25 x 40 single needle placements with local twitch response (LTR) as identified by deep ache. Also performed TDN to R cervical multifidus (C3) and R splenius cervicis/capitus with 2, 0.25 x 40 single needle placements (one in each location). Deep ache reported during both placements. Pistoning technique utilized. No worsening of pain afterwards.     Progressed manual techniques for R upper cervical pain. Introduced passive accessory cervical mobilizations and repeated trigger point dry needling including cervical multifidus and splenius cervicis/capitis. Pt encouraged to continue her elbow HEP as well as neck stretches. Follow-up as scheduled. Pt will benefit from PT services to address deficits in neck and elbow pain in order to return to full function at home and work with less pain.            PT Short Term Goals - 06/17/21 1614       PT SHORT TERM GOAL #1   Title Pt will be independent with HEP in order to improve strength and decrease elbow pain in order to improve pain-free function at home and with leisure activities such as weight lifting    Time 4    Period Weeks    Status Partially Met    Target Date 06/15/21               PT Long Term Goals - 07/02/21 1436       PT LONG TERM GOAL #1   Title Pt will increase FOTO to at least 66 in order to demonstrate significant improvement in function related to L lateral elbow pain    Baseline 05/18/21: 56; 06/18/21: 60    Time 8    Period Weeks    Status Partially Met    Target Date 07/13/21      PT LONG TERM GOAL #2   Title Pt will report no further  L lateral elbow pain with lifting groceries, lifting her dogs, and lifting weights during exercise in order to resume full function without pain    Baseline 05/18/21: Worst pain: 2/10; 06/17/21: 4/10;    Time 8    Period Weeks    Status On-going    Target Date 07/13/21      PT LONG TERM GOAL #3   Title Pt will decrease quick DASH score by at least 8% in order to demonstrate clinically significant reduction in disability related to L lateral elbow pain    Baseline 05/18/21: to be completed at next visit; 05/20/21: 15.9%; 06/17/21: 27.3%    Time 8    Period Weeks    Status On-going    Target Date 07/13/21  PT LONG TERM GOAL #4   Title Pt will demonstrate decrease in NDI by at least 19% in order to demonstrate clinically significant reduction in disability related to neck injury/pain    Baseline 06/17/21: 34%    Time 8    Period Weeks    Status New    Target Date 07/13/21      PT LONG TERM GOAL #5   Title Pt will decrease worst neck pain as reported on NPRS by at least 2 points in order to demonstrate clinically significant reduction in neck pain.    Baseline 07/01/21: worst neck pain: 6/10;    Time 8    Period Weeks    Status New    Target Date 07/13/21                   Plan - 07/06/21 1625     Clinical Impression Statement Progressed manual techniques for R upper cervical pain. Introduced passive accessory cervical mobilizations and repeated trigger point dry needling including cervical multifidus and splenius cervicis/capitis. Pt encouraged to continue her elbow HEP as well as neck stretches. Follow-up as scheduled. Pt will benefit from PT services to address deficits in neck and elbow pain in order to return to full function at home and work with less pain.    Personal Factors and Comorbidities Comorbidity 1;Time since onset of injury/illness/exacerbation    Comorbidities Anxiety    Examination-Activity Limitations Lift    Examination-Participation Restrictions  Community Activity;Shop    Stability/Clinical Decision Making Stable/Uncomplicated    Rehab Potential Excellent    PT Frequency 2x / week    PT Duration 8 weeks    PT Treatment/Interventions ADLs/Self Care Home Management;Aquatic Therapy;Biofeedback;Canalith Repostioning;Cryotherapy;Electrical Stimulation;Iontophoresis 36m/ml Dexamethasone;Moist Heat;Traction;Ultrasound;Therapeutic activities;Therapeutic exercise;Balance training;Neuromuscular re-education;Orthotic Fit/Training;Manual techniques;Passive range of motion;Dry needling;Vestibular;Spinal Manipulations;Joint Manipulations    PT Next Visit Plan continue with strengthening, manual techniques as needed,    PT Home Exercise Plan Access Code: [email or web address] (also added resisted elbow flexion with palm up, palm in, and palm down)    Consulted and Agree with Plan of Care Patient             Patient will benefit from skilled therapeutic intervention in order to improve the following deficits and impairments:  Pain, Decreased range of motion  Visit Diagnosis: Cervicalgia     Problem List Patient Active Problem List   Diagnosis Date Noted   Foraminal stenosis of cervical region 06/07/2021   Cervical paraspinal muscle spasm 05/03/2021   Other fatigue 04/07/2021   Tachycardia 04/07/2021   Vertigo 04/07/2021   Lateral epicondylitis of left elbow 04/05/2021   Depression with anxiety 10/19/2020   JPhillips GroutPT, DPT, GCS  Anias Bartol, PT 07/07/2021, 4:22 PM  [place] [facility] [place] [address]Phone: [phone]  Fax:  [phone] Name: HAnalei WhineryMRN: [id number] of Birth: [birth year]

## 2021-07-07 NOTE — Patient Instructions (Signed)
Access Code: [email or web address] URL: [email or web address] Date: 07/07/2021 Prepared by: Ria Comment  Exercises Standing Wrist Flexion Stretch - 2 x daily - 7 x weekly - 3 reps - 30-45s hold Forearm Pronation and Supination with Hammer - 2 x daily - 7 x weekly - 2 sets - 10 reps - 5s hold Seated Wrist Radial Deviation with Dumbbell - 2 x daily - 7 x weekly - 2 sets - 10 reps - 5s hold Seated Wrist Extension with Dumbbell - 2 x daily - 7 x weekly - 2 sets - 10 reps - 5s hold Seated Gentle Upper Trapezius Stretch - 2 x daily - 7 x weekly - 3 reps - 30-45s hold Seated Cervical Sidebending Stretch - 2 x daily - 7 x weekly - 3 reps - 30-45s hold Seated Levator Scapulae Stretch (Mirrored) - 2 x daily - 7 x weekly - 3 reps - 30-45s hold

## 2021-07-08 ENCOUNTER — Ambulatory Visit: Payer: 59

## 2021-07-08 ENCOUNTER — Other Ambulatory Visit: Payer: Self-pay

## 2021-07-08 DIAGNOSIS — M25522 Pain in left elbow: Secondary | ICD-10-CM

## 2021-07-08 DIAGNOSIS — M542 Cervicalgia: Secondary | ICD-10-CM

## 2021-07-08 NOTE — Therapy (Signed)
[facility] [address] [place], [place], [address] Phone: [phone]   Fax:  [phone]  Physical Therapy Treatment  Patient Details  Name: Emily Gordon MRN: [id number] Date of Birth: 20-Dec-1980 Referring Provider (PT): Dr Zigmund Daniel   Encounter Date: 07/08/2021   PT End of Session - 07/08/21 1707     Visit Number 14    Number of Visits 17    Date for PT Re-Evaluation 07/13/21    Authorization Type [employer] NAP    Authorization Time Period 05/18/21-07/13/21    Progress Note Due on Visit 20    PT Start Time 1615    PT Stop Time 1700    PT Time Calculation (min) 45 min    Activity Tolerance Patient tolerated treatment well    Behavior During Therapy [facility] for tasks assessed/performed             Past Medical History:  Diagnosis Date   Anxiety    Asthma    Ovarian cyst     Past Surgical History:  Procedure Laterality Date   BREAST LUMPECTOMY Right     There were no vitals filed for this visit.   Subjective Assessment - 07/08/21 1622     Subjective Pt doing well today. She reports that her neck pain has been improved over the last few days. She also has not had any elbow pain. No specific questions or concerns.    Pertinent History Pt reports L lateral elbow pain x 6 months after an 8 hour car drive. She saw Dr. Army Melia on 03/25/21 who ordered plain film radiographs and referred her to Dr. Zigmund Daniel. Elbow radiographs 03/25/21 showed no acute osseous injury of the left elbow. Independent interpretation by Dr. Zigmund Daniel stated maintained articular surfaces, no cortical irregularities or roughening, no abnormal soft tissue shadow appreciated, no acute osseous process identified. He started her on Meloxicam and tennis elbow strap. He also provided her some stretches and strengthening exercises to perform at home. She followed up with him on 05/03/21 with significant improvement in her symptoms and was referred to PT. She still has  not returned to working out with weights due to the pain. No weakness reported in LUE. Denies N/T. No radiating pain up or down LUE. Pt has a history of carpal tunnel in LUE however it spontaneously resolved. She was also referred for cervical paraspinal spasm however upon arrival she denies any neck pain and states that she would like to focus on her elbow pain. 07/01/21 Neck pain:  R sided posterior neck pain for multiple years. No trauma at onset or previous history of injury. Pain worsens with exercising and extended sitting. Possible history of torticollis as an infant. A few years back (possibly 5 years) she had a NCV study but is unsure of the results. She complains of regular headaches related to the pain. Cervical radiographs 05/03/21 showed straightening of the cervical spine with possible left foraminal narrowing C3 through C5. Otherwise negative.    Limitations Lifting    Diagnostic tests See history    Patient Stated Goals Pt would like to return to lifting weights without an increase in her pain               TREATMENT   Manual Therapy  Moist heat pack applied to posterior cervical paraspinals with patient in prone during interval history prior to stretching to improve tissue extensibility, no skin irritation noted; Prone C2-C4 grade III CPA mobilizations, 30s/bout x 2 bouts/level; Prone  C2-C4 grade III R UPA mobilizations, 30s/bout x 2 bouts/level; Prone C1 R lateral mass PA mobilizations grade III, 30s/bout x 2 bouts; Prone STM to R suboccipitals, cervical paraspinals, and upper trap with intermittent trigger point long hold release; Supine R upper trap, lateral flexion, and levator stretches x 30s each; HEP updated and reinforced;   Ther-ex  Prone cervical retractions with 2-second hold x10; Supine cervical retractions in a pillow with 2-second hold x10; Supine cervical isometrics for rotation and lateral flexion bilaterally 5-second hold x10 each;   Pt educated  throughout session about proper posture and technique with exercises. Improved exercise technique, movement at target joints, use of target muscles after min to mod verbal, visual, tactile cues.    Continued manual techniques for R upper cervical pain including passive accessory cervical mobilizations and soft tissue mobilization.  Deferred trigger point dry needling today as patient denies any pain upon arrival.  Performed cervical strengthening for long-term management of pain.  HEP updated and reinforced.  Pt encouraged to follow-up as scheduled. She will benefit from PT services to address deficits in neck and elbow pain in order to return to full function at home and work with less pain.            PT Short Term Goals - 06/17/21 1614       PT SHORT TERM GOAL #1   Title Pt will be independent with HEP in order to improve strength and decrease elbow pain in order to improve pain-free function at home and with leisure activities such as weight lifting    Time 4    Period Weeks    Status Partially Met    Target Date 06/15/21               PT Long Term Goals - 07/02/21 1436       PT LONG TERM GOAL #1   Title Pt will increase FOTO to at least 66 in order to demonstrate significant improvement in function related to L lateral elbow pain    Baseline 05/18/21: 56; 06/18/21: 60    Time 8    Period Weeks    Status Partially Met    Target Date 07/13/21      PT LONG TERM GOAL #2   Title Pt will report no further L lateral elbow pain with lifting groceries, lifting her dogs, and lifting weights during exercise in order to resume full function without pain    Baseline 05/18/21: Worst pain: 2/10; 06/17/21: 4/10;    Time 8    Period Weeks    Status On-going    Target Date 07/13/21      PT LONG TERM GOAL #3   Title Pt will decrease quick DASH score by at least 8% in order to demonstrate clinically significant reduction in disability related to L lateral elbow pain    Baseline  05/18/21: to be completed at next visit; 05/20/21: 15.9%; 06/17/21: 27.3%    Time 8    Period Weeks    Status On-going    Target Date 07/13/21      PT LONG TERM GOAL #4   Title Pt will demonstrate decrease in NDI by at least 19% in order to demonstrate clinically significant reduction in disability related to neck injury/pain    Baseline 06/17/21: 34%    Time 8    Period Weeks    Status New    Target Date 07/13/21      PT LONG TERM GOAL #5  Title Pt will decrease worst neck pain as reported on NPRS by at least 2 points in order to demonstrate clinically significant reduction in neck pain.    Baseline 07/01/21: worst neck pain: 6/10;    Time 8    Period Weeks    Status New    Target Date 07/13/21                   Plan - 07/08/21 1658     Clinical Impression Statement Continued manual techniques for R upper cervical pain including passive accessory cervical mobilizations and soft tissue mobilization.  Deferred trigger point dry needling today as patient denies any pain upon arrival.  Performed cervical strengthening for long-term management of pain.  HEP updated and reinforced.  Pt encouraged to follow-up as scheduled. She will benefit from PT services to address deficits in neck and elbow pain in order to return to full function at home and work with less pain.    Personal Factors and Comorbidities Comorbidity 1;Time since onset of injury/illness/exacerbation    Comorbidities Anxiety    Examination-Activity Limitations Lift    Examination-Participation Restrictions Community Activity;Shop    Stability/Clinical Decision Making Stable/Uncomplicated    Rehab Potential Excellent    PT Frequency 2x / week    PT Duration 8 weeks    PT Treatment/Interventions ADLs/Self Care Home Management;Aquatic Therapy;Biofeedback;Canalith Repostioning;Cryotherapy;Electrical Stimulation;Iontophoresis 32m/ml Dexamethasone;Moist Heat;Traction;Ultrasound;Therapeutic activities;Therapeutic  exercise;Balance training;Neuromuscular re-education;Orthotic Fit/Training;Manual techniques;Passive range of motion;Dry needling;Vestibular;Spinal Manipulations;Joint Manipulations    PT Next Visit Plan continue with strengthening, manual techniques as needed,    PT Home Exercise Plan Access Code: [email or web address] (also added resisted elbow flexion with palm up, palm in, and palm down)    Consulted and Agree with Plan of Care Patient             Patient will benefit from skilled therapeutic intervention in order to improve the following deficits and impairments:  Pain, Decreased range of motion  Visit Diagnosis: Cervicalgia  Pain in left elbow     Problem List Patient Active Problem List   Diagnosis Date Noted   Foraminal stenosis of cervical region 06/07/2021   Cervical paraspinal muscle spasm 05/03/2021   Other fatigue 04/07/2021   Tachycardia 04/07/2021   Vertigo 04/07/2021   Lateral epicondylitis of left elbow 04/05/2021   Depression with anxiety 10/19/2020   Emily GroutPT, DPT, GCS  Emily Gordon, PT 07/09/2021, 9:02 AM  [place] [facility] [place] [address]Phone: [phone]  Fax:  [phone] Name: Emily HaughtonMRN: [id number] of Birth: [birth year]

## 2021-07-08 NOTE — Patient Instructions (Signed)
Access Code: [email or web address] URL: [email or web address] Date: 07/08/2021 Prepared by: Ria Comment  Exercises Standing Wrist Flexion Stretch - 2 x daily - 7 x weekly - 3 reps - 30-45s hold Forearm Pronation and Supination with Hammer - 2 x daily - 7 x weekly - 2 sets - 10 reps - 5s hold Seated Wrist Radial Deviation with Dumbbell - 2 x daily - 7 x weekly - 2 sets - 10 reps - 5s hold Seated Wrist Extension with Dumbbell - 2 x daily - 7 x weekly - 2 sets - 10 reps - 5s hold Seated Gentle Upper Trapezius Stretch - 2 x daily - 7 x weekly - 3 reps - 30-45s hold Seated Cervical Sidebending Stretch - 2 x daily - 7 x weekly - 3 reps - 30-45s hold Seated Levator Scapulae Stretch (Mirrored) - 2 x daily - 7 x weekly - 3 reps - 30-45s hold Supine Chin Tuck - 2 x daily - 7 x weekly - 2 sets - 10 reps - 5s hold

## 2021-07-13 ENCOUNTER — Other Ambulatory Visit: Payer: Self-pay

## 2021-07-13 ENCOUNTER — Ambulatory Visit: Payer: 59

## 2021-07-13 DIAGNOSIS — M542 Cervicalgia: Secondary | ICD-10-CM | POA: Diagnosis not present

## 2021-07-13 NOTE — Therapy (Signed)
[facility] [address] [place], [place], [address] Phone: [phone]   Fax:  [phone]  Physical Therapy Treatment  Patient Details  Name: Emily Gordon MRN: [id number] Date of Birth: 1981/02/21 Referring Provider (PT): Dr Zigmund Daniel   Encounter Date: 07/13/2021   PT End of Session - 07/14/21 2131     Visit Number 15    Number of Visits 33    Date for PT Re-Evaluation 09/08/21    Authorization Type [employer]et[employer] NAP    Authorization Time Period 05/18/21-07/13/21    Progress Note Due on Visit --    PT Start Time 1615    PT Stop Time 1700    PT Time Calculation (min) 45 min    Activity Tolerance Patient tolerated treatment well    Behavior During Therapy [facility] for tasks assessed/performed              Past Medical History:  Diagnosis Date   Anxiety    Asthma    Ovarian cyst     Past Surgical History:  Procedure Laterality Date   BREAST LUMPECTOMY Right     There were no vitals filed for this visit.   Subjective Assessment - 07/13/21 1624     Subjective Pt doing well today. She denies any neck pain upon arrival today and reports that she feels like her neck mobility is better. She has continued to have some anterior antecubital elbow pain but her posterolateral extensor mass pain remains resolved. No specific questions or concerns.    Pertinent History Pt reports L lateral elbow pain x 6 months after an 8 hour car drive. She saw Dr. Army Melia on 03/25/21 who ordered plain film radiographs and referred her to Dr. Zigmund Daniel. Elbow radiographs 03/25/21 showed no acute osseous injury of the left elbow. Independent interpretation by Dr. Zigmund Daniel stated maintained articular surfaces, no cortical irregularities or roughening, no abnormal soft tissue shadow appreciated, no acute osseous process identified. He started her on Meloxicam and tennis elbow strap. He also provided her some stretches and strengthening exercises to perform at home.  She followed up with him on 05/03/21 with significant improvement in her symptoms and was referred to PT. She still has not returned to working out with weights due to the pain. No weakness reported in LUE. Denies N/T. No radiating pain up or down LUE. Pt has a history of carpal tunnel in LUE however it spontaneously resolved. She was also referred for cervical paraspinal spasm however upon arrival she denies any neck pain and states that she would like to focus on her elbow pain. 07/01/21 Neck pain:  R sided posterior neck pain for multiple years. No trauma at onset or previous history of injury. Pain worsens with exercising and extended sitting. Possible history of torticollis as an infant. A few years back (possibly 5 years) she had a NCV study but is unsure of the results. She complains of regular headaches related to the pain. Cervical radiographs 05/03/21 showed straightening of the cervical spine with possible left foraminal narrowing C3 through C5. Otherwise negative.    Limitations Lifting    Diagnostic tests See history    Patient Stated Goals Pt would like to return to lifting weights without an increase in her pain               TREATMENT   Manual Therapy  Moist heat pack applied to posterior cervical paraspinals with patient in prone during interval history prior to stretching to improve tissue  extensibility, no skin irritation noted; Prone C2-C4 grade III CPA mobilizations, 30s/bout x 2 bouts/level; Prone C2-C4 grade III bilateral UPA mobilizations, 30s/bout x 2 bouts/level; Prone C1 R lateral mass PA mobilizations grade III, 30s/bout x 2 bouts; Prone STM with intermittent instrument assist to R suboccipitals, cervical paraspinals, and upper trap with intermittent trigger point long hold release; Supine R upper trap, lateral flexion, and levator stretches 2 x 30s each; HEP updated and reinforced;   Ther-ex  Prone cervical retractions with 2-second hold x10; Supine cervical  retractions in a pillow with 2-second hold x10; Supine cervical isometrics for rotation and lateral flexion bilaterally 5-second hold x10 each;   Pt educated throughout session about proper posture and technique with exercises. Improved exercise technique, movement at target joints, use of target muscles after min to mod verbal, visual, tactile cues.    Patient arrives with excellent motivation to participate in therapy today.  Goals updated on 06/17/21 visit so outcome measures were not repeated today. Patient has achieved complete resolution of pain in the entire lateral mass/wrist extensors of her left forearm. However she is now reporting progressively increasing pain in the antecubital fossa just lateral to midline of her L elbow. It is difficult to determine the exact site of her pain as active/passive ROM and isometric strength testing is inconsistently painful. Per therapist instruction pt had tried to utilize her tennis elbow strap however she reports that this was making her anterior elbow pain worse. Her FOTO score improved from 56 to 60, however her QuickDASH worsened from 15.9% to 27.3% related to her worsening anterior elbow pain. She is awaiting an MRI of her L elbow and wanted to move on to treating her neck pain. Her neck has not been painful over the last few visits and pt has responded well to manual techniques and trigger point dry needling. Plan is to continue treating patients neck pain until she achieves full resolution. Patient would benefit from continued PT services to address deficits in left lateral elbow and neck pain in order to improve function at home and with leisure activities.           PT Short Term Goals - 07/14/21 2131       PT SHORT TERM GOAL #1   Title Pt will be independent with HEP in order to improve strength and decrease elbow pain in order to improve pain-free function at home and with leisure activities such as weight lifting    Time 4    Period  Weeks    Status Achieved               PT Long Term Goals - 07/14/21 2132       PT LONG TERM GOAL #1   Title Pt will increase FOTO to at least 66 in order to demonstrate significant improvement in function related to L lateral elbow pain    Baseline 05/18/21: 56; 06/18/21: 60    Time 8    Period Weeks    Status Partially Met    Target Date 09/08/21      PT LONG TERM GOAL #2   Title Pt will report no further L lateral elbow pain with lifting groceries, lifting her dogs, and lifting weights during exercise in order to resume full function without pain    Baseline 05/18/21: Worst pain: 2/10; 06/17/21: 4/10;    Time 8    Period Weeks    Status On-going    Target Date 09/08/21  PT LONG TERM GOAL #3   Title Pt will decrease quick DASH score by at least 8% in order to demonstrate clinically significant reduction in disability related to L lateral elbow pain    Baseline 05/18/21: to be completed at next visit; 05/20/21: 15.9%; 06/17/21: 27.3%    Time 8    Period Weeks    Status On-going    Target Date 09/08/21      PT LONG TERM GOAL #4   Title Pt will demonstrate decrease in NDI by at least 19% in order to demonstrate clinically significant reduction in disability related to neck injury/pain    Baseline 06/17/21: 34%    Time 8    Period Weeks    Status On-going    Target Date 09/08/21      PT LONG TERM GOAL #5   Title Pt will decrease worst neck pain as reported on NPRS by at least 2 points in order to demonstrate clinically significant reduction in neck pain.    Baseline 07/01/21: worst neck pain: 6/10;    Time 8    Period Weeks    Status On-going    Target Date 09/08/21                   Plan - 07/13/21 1658     Clinical Impression Statement Patient arrives with excellent motivation to participate in therapy today.  Goals updated on 06/17/21 visit so outcome measures were not repeated today. Patient has achieved complete resolution of pain in the entire  lateral mass/wrist extensors of her left forearm. However she is now reporting progressively increasing pain in the antecubital fossa just lateral to midline of her L elbow. It is difficult to determine the exact site of her pain as active/passive ROM and isometric strength testing is inconsistently painful. Per therapist instruction pt had tried to utilize her tennis elbow strap however she reports that this was making her anterior elbow pain worse. Her FOTO score improved from 56 to 60, however her QuickDASH worsened from 15.9% to 27.3% related to her worsening anterior elbow pain. She is awaiting an MRI of her L elbow and wanted to move on to treating her neck pain. Her neck has not been painful over the last few visits and pt has responded well to manual techniques and trigger point dry needling. Plan is to continue treating patients neck pain until she achieves full resolution. Patient would benefit from continued PT services to address deficits in left lateral elbow and neck pain in order to improve function at home and with leisure activities.    Personal Factors and Comorbidities Comorbidity 1;Time since onset of injury/illness/exacerbation    Comorbidities Anxiety    Examination-Activity Limitations Lift    Examination-Participation Restrictions Community Activity;Shop    Stability/Clinical Decision Making Stable/Uncomplicated    Rehab Potential Excellent    PT Frequency 2x / week    PT Duration 8 weeks    PT Treatment/Interventions ADLs/Self Care Home Management;Aquatic Therapy;Biofeedback;Canalith Repostioning;Cryotherapy;Electrical Stimulation;Iontophoresis 24m/ml Dexamethasone;Moist Heat;Traction;Ultrasound;Therapeutic activities;Therapeutic exercise;Balance training;Neuromuscular re-education;Orthotic Fit/Training;Manual techniques;Passive range of motion;Dry needling;Vestibular;Spinal Manipulations;Joint Manipulations    PT Next Visit Plan continue with strengthening, manual techniques as  needed,    PT Home Exercise Plan Access Code: [email or web address] (also added resisted elbow flexion with palm up, palm in, and palm down)    Consulted and Agree with Plan of Care Patient             Patient will benefit from skilled therapeutic intervention in order to improve  the following deficits and impairments:  Pain, Decreased range of motion  Visit Diagnosis: Cervicalgia     Problem List Patient Active Problem List   Diagnosis Date Noted   Foraminal stenosis of cervical region 06/07/2021   Cervical paraspinal muscle spasm 05/03/2021   Other fatigue 04/07/2021   Tachycardia 04/07/2021   Vertigo 04/07/2021   Lateral epicondylitis of left elbow 04/05/2021   Depression with anxiety 10/19/2020   Phillips Grout PT, DPT, GCS  Kiele Heavrin, PT 07/14/2021, 9:42 PM  [place] [facility] [address] [place], [place], [address] Phone: [phone]   Fax:  [phone]  Name: Digna Countess MRN: [id number] Date of Birth: 06-22-1981

## 2021-07-15 ENCOUNTER — Other Ambulatory Visit: Payer: Self-pay

## 2021-07-15 ENCOUNTER — Ambulatory Visit: Payer: 59

## 2021-07-15 DIAGNOSIS — M542 Cervicalgia: Secondary | ICD-10-CM

## 2021-07-16 NOTE — Therapy (Signed)
[facility] [address] [place], [place], [address] Phone: [phone]   Fax:  [phone]  Physical Therapy Treatment  Patient Details  Name: Emily Gordon MRN: [id number] Date of Birth: [birth year] Referring Provider (PT): Dr Zigmund Daniel   Encounter Date: 07/15/2021   PT End of Session - 07/16/21 0938     Visit Number 16    Number of Visits 33    Date for PT Re-Evaluation 09/08/21    Authorization Type [employer] NAP    Authorization Time Period 05/18/21-07/13/21    PT Start Time 1615    PT Stop Time 1700    PT Time Calculation (min) 45 min    Activity Tolerance Patient tolerated treatment well    Behavior During Therapy [facility] for tasks assessed/performed              Past Medical History:  Diagnosis Date   Anxiety    Asthma    Ovarian cyst     Past Surgical History:  Procedure Laterality Date   BREAST LUMPECTOMY Right     There were no vitals filed for this visit.   Subjective Assessment - 07/16/21 0935     Subjective Pt doing well today. She denies any neck pain upon arrival today. She continues to notice improvement in her neck mobility. She has not had a headache in a couple days and also feels like these are improving.  She has continued to have some anterior antecubital elbow pain but her posterolateral extensor mass pain remains resolved. No specific questions or concerns.    Pertinent History Pt reports L lateral elbow pain x 6 months after an 8 hour car drive. She saw Dr. Army Melia on 03/25/21 who ordered plain film radiographs and referred her to Dr. Zigmund Daniel. Elbow radiographs 03/25/21 showed no acute osseous injury of the left elbow. Independent interpretation by Dr. Zigmund Daniel stated maintained articular surfaces, no cortical irregularities or roughening, no abnormal soft tissue shadow appreciated, no acute osseous process identified. He started her on Meloxicam and tennis elbow strap. He also provided her some  stretches and strengthening exercises to perform at home. She followed up with him on 05/03/21 with significant improvement in her symptoms and was referred to PT. She still has not returned to working out with weights due to the pain. No weakness reported in LUE. Denies N/T. No radiating pain up or down LUE. Pt has a history of carpal tunnel in LUE however it spontaneously resolved. She was also referred for cervical paraspinal spasm however upon arrival she denies any neck pain and states that she would like to focus on her elbow pain. 07/01/21 Neck pain:  R sided posterior neck pain for multiple years. No trauma at onset or previous history of injury. Pain worsens with exercising and extended sitting. Possible history of torticollis as an infant. A few years back (possibly 5 years) she had a NCV study but is unsure of the results. She complains of regular headaches related to the pain. Cervical radiographs 05/03/21 showed straightening of the cervical spine with possible left foraminal narrowing C3 through C5. Otherwise negative.    Limitations Lifting    Diagnostic tests See history    Patient Stated Goals Pt would like to return to lifting weights without an increase in her pain    Currently in Pain? No/denies               TREATMENT   Manual Therapy  Moist heat pack applied to posterior cervical  paraspinals with patient in prone during interval history prior to stretching to improve tissue extensibility, no skin irritation noted; Prone C2-C4 grade III CPA mobilizations, 30s/bout x 2 bouts/level; Prone C2-C4 grade III bilateral UPA mobilizations, 30s/bout x 2 bouts/level; Prone C1 R lateral mass PA mobilizations grade III, 30s/bout x 2 bouts; Prone STM with intermittent instrument assist to R suboccipitals, cervical paraspinals, and upper trap with intermittent trigger point long hold release; Supine R upper trap, lateral flexion, and levator stretches 2 x 30s each;   Ther-ex  UBE x 4  minutes (2 minutes forward/2 minutes backward) during interval history; Supine cervical retractions in a pillow with 2-second hold x10; Supine cervical manually resisted rotation and lateral flexion bilaterally x10 each; Seated blue tband rows 2 x 15;   Pt educated throughout session about proper posture and technique with exercises. Improved exercise technique, movement at target joints, use of target muscles after min to mod verbal, visual, tactile cues.    Patient has made significant progress with respect to her neck pain.  She denies any pain upon arrival and reports an improvement in her headaches over the last few days.  Continued with cervical manual techniques as well as cervical strengthening for long-term management of pain.  Plan is to continue treating patients neck pain until she achieves full resolution. Patient would benefit from continued PT services to address deficits in left lateral elbow and neck pain in order to improve function at home and with leisure activities.           PT Short Term Goals - 07/14/21 2131       PT SHORT TERM GOAL #1   Title Pt will be independent with HEP in order to improve strength and decrease elbow pain in order to improve pain-free function at home and with leisure activities such as weight lifting    Time 4    Period Weeks    Status Achieved               PT Long Term Goals - 07/14/21 2132       PT LONG TERM GOAL #1   Title Pt will increase FOTO to at least 66 in order to demonstrate significant improvement in function related to L lateral elbow pain    Baseline 05/18/21: 56; 06/18/21: 60    Time 8    Period Weeks    Status Partially Met    Target Date 09/08/21      PT LONG TERM GOAL #2   Title Pt will report no further L lateral elbow pain with lifting groceries, lifting her dogs, and lifting weights during exercise in order to resume full function without pain    Baseline 05/18/21: Worst pain: 2/10; 06/17/21: 4/10;     Time 8    Period Weeks    Status On-going    Target Date 09/08/21      PT LONG TERM GOAL #3   Title Pt will decrease quick DASH score by at least 8% in order to demonstrate clinically significant reduction in disability related to L lateral elbow pain    Baseline 05/18/21: to be completed at next visit; 05/20/21: 15.9%; 06/17/21: 27.3%    Time 8    Period Weeks    Status On-going    Target Date 09/08/21      PT LONG TERM GOAL #4   Title Pt will demonstrate decrease in NDI by at least 19% in order to demonstrate clinically significant reduction in disability related to  neck injury/pain    Baseline 06/17/21: 34%    Time 8    Period Weeks    Status On-going    Target Date 09/08/21      PT LONG TERM GOAL #5   Title Pt will decrease worst neck pain as reported on NPRS by at least 2 points in order to demonstrate clinically significant reduction in neck pain.    Baseline 07/01/21: worst neck pain: 6/10;    Time 8    Period Weeks    Status On-going    Target Date 09/08/21                   Plan - 07/16/21 7711     Clinical Impression Statement Patient has made significant progress with respect to her neck pain.  She denies any pain upon arrival and reports an improvement in her headaches over the last few days.  Continued with cervical manual techniques as well as cervical strengthening for long-term management of pain.  Plan is to continue treating patients neck pain until she achieves full resolution. Patient would benefit from continued PT services to address deficits in left lateral elbow and neck pain in order to improve function at home and with leisure activities.    Personal Factors and Comorbidities Comorbidity 1;Time since onset of injury/illness/exacerbation    Comorbidities Anxiety    Examination-Activity Limitations Lift    Examination-Participation Restrictions Community Activity;Shop    Stability/Clinical Decision Making Stable/Uncomplicated    Rehab Potential  Excellent    PT Frequency 2x / week    PT Duration 8 weeks    PT Treatment/Interventions ADLs/Self Care Home Management;Aquatic Therapy;Biofeedback;Canalith Repostioning;Cryotherapy;Electrical Stimulation;Iontophoresis 46m/ml Dexamethasone;Moist Heat;Traction;Ultrasound;Therapeutic activities;Therapeutic exercise;Balance training;Neuromuscular re-education;Orthotic Fit/Training;Manual techniques;Passive range of motion;Dry needling;Vestibular;Spinal Manipulations;Joint Manipulations    PT Next Visit Plan continue with strengthening, manual techniques as needed,    PT Home Exercise Plan Access Code: [email or web address] (also added resisted elbow flexion with palm up, palm in, and palm down)    Consulted and Agree with Plan of Care Patient             Patient will benefit from skilled therapeutic intervention in order to improve the following deficits and impairments:  Pain, Decreased range of motion  Visit Diagnosis: Cervicalgia     Problem List Patient Active Problem List   Diagnosis Date Noted   Foraminal stenosis of cervical region 06/07/2021   Cervical paraspinal muscle spasm 05/03/2021   Other fatigue 04/07/2021   Tachycardia 04/07/2021   Vertigo 04/07/2021   Lateral epicondylitis of left elbow 04/05/2021   Depression with anxiety 10/19/2020   JPhillips GroutPT, DPT, GCS  Anhelica Fowers, PT 07/16/2021, 9:47 AM  [facility] [place] [address]Phone: [phone]  Fax:  [phone] Name: HHarlei LehrmannMRN: [id number] of Birth: 130-Sep-1982

## 2021-07-20 ENCOUNTER — Other Ambulatory Visit: Payer: Self-pay

## 2021-07-20 ENCOUNTER — Ambulatory Visit: Payer: 59

## 2021-07-20 ENCOUNTER — Encounter: Payer: Self-pay | Admitting: Internal Medicine

## 2021-07-20 DIAGNOSIS — M542 Cervicalgia: Secondary | ICD-10-CM | POA: Diagnosis not present

## 2021-07-20 DIAGNOSIS — M25522 Pain in left elbow: Secondary | ICD-10-CM

## 2021-07-20 NOTE — Therapy (Signed)
[facility] [address] [place], [place], [address] Phone: [phone]   Fax:  [phone]  Physical Therapy Treatment/Goal Update  Patient Details  Name: Emily Gordon MRN: [id number] Date of Birth: 07/15/1981 Referring Provider (PT): Dr Zigmund Daniel   Encounter Date: 07/20/2021   PT End of Session - 07/20/21 1617     Visit Number 17    Number of Visits 33    Date for PT Re-Evaluation 09/08/21    Authorization Type [employer] NAP    Authorization Time Period 05/18/21-07/13/21    PT Start Time 1615    PT Stop Time 1705    PT Time Calculation (min) 50 min    Activity Tolerance Patient tolerated treatment well    Behavior During Therapy [facility] for tasks assessed/performed              Past Medical History:  Diagnosis Date   Anxiety    Asthma    Ovarian cyst     Past Surgical History:  Procedure Laterality Date   BREAST LUMPECTOMY Right     There were no vitals filed for this visit.   Subjective Assessment - 07/20/21 1616     Subjective Pt doing well today. She denies any neck pain upon arrival today. Headache frequency has improved. No resting elbow pain upon arrival but she has continued to have some anterior antecubital elbow pain but her posterolateral extensor mass pain remains resolved. No specific questions or concerns.    Pertinent History Pt reports L lateral elbow pain x 6 months after an 8 hour car drive. She saw Dr. Army Melia on 03/25/21 who ordered plain film radiographs and referred her to Dr. Zigmund Daniel. Elbow radiographs 03/25/21 showed no acute osseous injury of the left elbow. Independent interpretation by Dr. Zigmund Daniel stated maintained articular surfaces, no cortical irregularities or roughening, no abnormal soft tissue shadow appreciated, no acute osseous process identified. He started her on Meloxicam and tennis elbow strap. He also provided her some stretches and strengthening exercises to perform at home. She  followed up with him on 05/03/21 with significant improvement in her symptoms and was referred to PT. She still has not returned to working out with weights due to the pain. No weakness reported in LUE. Denies N/T. No radiating pain up or down LUE. Pt has a history of carpal tunnel in LUE however it spontaneously resolved. She was also referred for cervical paraspinal spasm however upon arrival she denies any neck pain and states that she would like to focus on her elbow pain. 07/01/21 Neck pain:  R sided posterior neck pain for multiple years. No trauma at onset or previous history of injury. Pain worsens with exercising and extended sitting. Possible history of torticollis as an infant. A few years back (possibly 5 years) she had a NCV study but is unsure of the results. She complains of regular headaches related to the pain. Cervical radiographs 05/03/21 showed straightening of the cervical spine with possible left foraminal narrowing C3 through C5. Otherwise negative.    Limitations Lifting    Diagnostic tests See history    Patient Stated Goals Pt would like to return to lifting weights without an increase in her pain    Currently in Pain? No/denies               TREATMENT   Ther-ex  UBE x 4 minutes (2 minutes forward/2 minutes backward) during interval history; Nautilus seated lat pull down 50# x 20, 70# x 20;  Nautilus seated low rows 40# x 20, 50# x 20; Nautilus standing chest press 50# 2 x 20; Nautilus standing tricep press down 30# 2 x 20; Nautilus standing shoulder extension 30# x 20, 40# x 20; Supine cervical retractions in a pillow with 2-second hold and overpressure by therapist x10; Supine cervical manually resisted rotation and lateral flexion bilaterally x10 each; Supine deep neck flexor lifts 10s hold x 10; Updated outcome measures/goals with patient: QuickDASH: 15.9%  NDI: 16% FOTO: 65 worst neck pain: 2/10; Neck improvement: 80% right side, 40% improvement L  side;   Pt educated throughout session about proper posture and technique with exercises. Improved exercise technique, movement at target joints, use of target muscles after min to mod verbal, visual, tactile cues.    Patient has made significant progress with respect to her neck pain.  She denies any pain upon arrival and reports a significant improvement in her headaches. Her worst neck pain over the last week was 2/10. Her NDI decreased from 34% at initial evaluation to 16% today. She reports 80% improvement in her R sided neck pain (worst side at initial evaluation) and 40% improvement in her L sided neck pain. Updated additional outcome measures and goals with patient today.  Patient has achieved complete resolution of pain with palpation in the entire lateral mass/wrist extensors of her left forearm. Her QuickDASH has decreased to 15.9% however she continues reporting pain in the antecubital fossa just lateral to midline of her L elbow. She has an MRI scheduled for her elbow later this week. Her FOTO score improved from 56 to 65 reaching her FOTO target. Plan is for patient to be discharged today however she was advised to call to scheduled additional visits after her L elbow MRI if indicated.           PT Short Term Goals - 07/20/21 1734       PT SHORT TERM GOAL #1   Title Pt will be independent with HEP in order to improve strength and decrease elbow pain in order to improve pain-free function at home and with leisure activities such as weight lifting    Time 4    Period Weeks    Status Achieved               PT Long Term Goals - 07/20/21 1734       PT LONG TERM GOAL #1   Title Pt will increase FOTO to at least 65 in order to demonstrate significant improvement in function related to L lateral elbow pain    Baseline 05/18/21: 56; 06/18/21: 60; 07/20/21: 65    Time 8    Period Weeks    Status Achieved      PT LONG TERM GOAL #2   Title Pt will report no further L lateral  elbow pain with lifting groceries, lifting her dogs, and lifting weights during exercise in order to resume full function without pain    Baseline 05/18/21: Worst pain: 2/10; 06/17/21: 4/10; 07/20/21: no further lateral elbow pain    Time 8    Period Weeks    Status Achieved      PT LONG TERM GOAL #3   Title Pt will decrease quick DASH score by at least 8% in order to demonstrate clinically significant reduction in disability related to L lateral elbow pain    Baseline 05/18/21: to be completed at next visit; 05/20/21: 15.9%; 06/17/21: 27.3%; 09/19/20: 15.9%    Time 8    Period Weeks  Status Not Met      PT LONG TERM GOAL #4   Title Pt will demonstrate decrease in NDI by at least 19% in order to demonstrate clinically significant reduction in disability related to neck injury/pain    Baseline 06/17/21: 34%; 07/20/21: 16%    Time 8    Period Weeks    Status Partially Met      PT LONG TERM GOAL #5   Title Pt will decrease worst neck pain as reported on NPRS by at least 2 points in order to demonstrate clinically significant reduction in neck pain.    Baseline 07/01/21: worst neck pain: 6/10; 07/20/21: 2/10;    Time 8    Period Weeks    Status Achieved                   Plan - 07/20/21 1618     Clinical Impression Statement Patient has made significant progress with respect to her neck pain.  She denies any pain upon arrival and reports a significant improvement in her headaches. Her worst neck pain over the last week was 2/10. Her NDI decreased from 34% at initial evaluation to 16% today. She reports 80% improvement in her R sided neck pain (worst side at initial evaluation) and 40% improvement in her L sided neck pain. Updated additional outcome measures and goals with patient today.  Patient has achieved complete resolution of pain with palpation in the entire lateral mass/wrist extensors of her left forearm. Her QuickDASH has decreased to 15.9% however she continues reporting  pain in the antecubital fossa just lateral to midline of her L elbow. She has an MRI scheduled for her elbow later this week. Her FOTO score improved from 56 to 65 reaching her FOTO target. Plan is for patient to be discharged today however she was advised to call to scheduled additional visits after her L elbow MRI if indicated. ?    Personal Factors and Comorbidities Comorbidity 1;Time since onset of injury/illness/exacerbation    Comorbidities Anxiety    Examination-Activity Limitations Lift    Examination-Participation Restrictions Community Activity;Shop    Stability/Clinical Decision Making Stable/Uncomplicated    Rehab Potential Excellent    PT Frequency 2x / week    PT Duration 8 weeks    PT Treatment/Interventions ADLs/Self Care Home Management;Aquatic Therapy;Biofeedback;Canalith Repostioning;Cryotherapy;Electrical Stimulation;Iontophoresis 7m/ml Dexamethasone;Moist Heat;Traction;Ultrasound;Therapeutic activities;Therapeutic exercise;Balance training;Neuromuscular re-education;Orthotic Fit/Training;Manual techniques;Passive range of motion;Dry needling;Vestibular;Spinal Manipulations;Joint Manipulations    PT Next Visit Plan Soft discharge today    PT Home Exercise Plan Access Code: [email or web address] (also added resisted elbow flexion with palm up, palm in, and palm down)    Consulted and Agree with Plan of Care Patient             Patient will benefit from skilled therapeutic intervention in order to improve the following deficits and impairments:  Pain, Decreased range of motion  Visit Diagnosis: Cervicalgia  Pain in left elbow     Problem List Patient Active Problem List   Diagnosis Date Noted   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

## 2021-07-21 ENCOUNTER — Telehealth: Payer: Self-pay

## 2021-07-21 NOTE — Telephone Encounter (Signed)
Completed PA on [email or web address] for Trintellix 10 mg.  KeyLucie Leather - PA Case ID: [id number]  PA was approved through insurance. Patient informed.

## 2021-07-24 ENCOUNTER — Ambulatory Visit
Admission: RE | Admit: 2021-07-24 | Discharge: 2021-07-24 | Disposition: A | Discharge status: Home / Self Care | Payer: 59 | Source: Ambulatory Visit | Attending: Family Medicine | Admitting: Family Medicine

## 2021-07-24 ENCOUNTER — Other Ambulatory Visit: Payer: Self-pay

## 2021-07-24 DIAGNOSIS — M7712 Lateral epicondylitis, left elbow: Secondary | ICD-10-CM

## 2021-07-24 DIAGNOSIS — M25522 Pain in left elbow: Secondary | ICD-10-CM

## 2021-07-27 DIAGNOSIS — E782 Mixed hyperlipidemia: Secondary | ICD-10-CM | POA: Insufficient documentation

## 2021-08-13 ENCOUNTER — Other Ambulatory Visit: Payer: Self-pay

## 2021-08-13 ENCOUNTER — Ambulatory Visit: Payer: 59 | Admitting: Internal Medicine

## 2021-08-13 ENCOUNTER — Encounter: Payer: Self-pay | Admitting: Internal Medicine

## 2021-08-13 VITALS — BP 128/76 | HR 111 | Temp 98.1°F | Ht 63.0 in | Wt 154.0 lb

## 2021-08-13 DIAGNOSIS — F411 Generalized anxiety disorder: Secondary | ICD-10-CM

## 2021-08-13 DIAGNOSIS — R7989 Other specified abnormal findings of blood chemistry: Secondary | ICD-10-CM

## 2021-08-13 MED ORDER — VORTIOXETINE HBR 10 MG PO TABS: 10.0000 mg | ORAL_TABLET | Freq: Every day | ORAL | 1 refills | Status: AC

## 2021-08-13 NOTE — Progress Notes (Signed)
Date:  08/13/2021   Name:  [place] Gordon   DOB:  [birth year]   MRN:  [id number]   Chief Complaint: Anxiety  Anxiety Presents for follow-up visit. Symptoms include excessive worry and nervous/anxious behavior. Patient reports no chest pain, compulsions, confusion, depressed mood, dizziness, nausea, palpitations, shortness of breath or suicidal ideas. Primary symptoms comment: worse recently due to moving to [place]. Symptoms occur occasionally. The severity of symptoms is moderate. The patient sleeps 8 hours per night. The quality of sleep is good. Nighttime awakenings: occasional.     Lab Results  Component Value Date   NA 139 05/25/2021   K 4.6 05/25/2021   CO2 24 05/25/2021   GLUCOSE 86 05/25/2021   BUN 10 05/25/2021   CREATININE 0.77 05/25/2021   CALCIUM 9.8 05/25/2021   EGFR 101 05/25/2021   Lab Results  Component Value Date   CHOL 277 (H) 05/25/2021   HDL 63 05/25/2021   LDLCALC 196 (H) 05/25/2021   TRIG 103 05/25/2021   CHOLHDL 4.4 05/25/2021   Lab Results  Component Value Date   TSH 1.740 12/21/2020   No results found for: HGBA1C Lab Results  Component Value Date   WBC 10.6 (H) 03/22/2021   HGB 13.7 03/22/2021   HCT 40.0 03/22/2021   MCV 87.5 03/22/2021   PLT 345 03/22/2021   Lab Results  Component Value Date   ALT 54 (H) 06/18/2021   AST 37 06/18/2021   ALKPHOS 117 06/18/2021   BILITOT 0.3 06/18/2021   Lab Results  Component Value Date   VD25OH 28.8 (L) 04/07/2021     Review of Systems  Constitutional:  Negative for chills, fatigue, fever and unexpected weight change.  Respiratory:  Negative for chest tightness and shortness of breath.   Cardiovascular:  Negative for chest pain and palpitations.  Gastrointestinal:  Negative for constipation and nausea.  Skin:  Positive for color change. Negative for rash.  Neurological:  Negative for dizziness and headaches.  Psychiatric/Behavioral:  Negative for confusion and suicidal ideas. The patient is  nervous/anxious.    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  Current Meds  Medication Sig   amphetamine-dextroamphetamine (ADDERALL XR) 10 MG 24 hr capsule    Cyanocobalamin (VITAMIN B-12 PO) Take 1 tablet by mouth daily.   VITAMIN D PO Take 1 capsule by mouth daily.   vortioxetine HBr (TRINTELLIX) 10 MG TABS tablet Take 1 tablet (10 mg total) by mouth daily.   [DISCONTINUED] meloxicam (MOBIC) 15 MG tablet Take 1 tablet (15 mg total) by mouth daily as needed for pain.    PHQ 2/9 Scores 08/13/2021 06/07/2021 05/25/2021 05/14/2021  PHQ - 2 Score '1 1 1 ' 0  PHQ- 9 Score '11 10 10 7    ' GAD 7 : Generalized Anxiety Score 08/13/2021  06/07/2021 05/25/2021 05/14/2021  Nervous, Anxious, on Edge '2 1 1 ' 0  Control/stop worrying '1 1 1 ' 0  Worry too much - different things 1 1 0 0  Trouble relaxing 1 1 0 0  Restless '2 3 1 ' 0  Easily annoyed or irritable 0 1 0 0  Afraid - awful might happen 0 0 0 0  Total GAD 7 Score '7 8 3 ' 0  Anxiety Difficulty - Extremely difficult Somewhat difficult -    BP Readings from Last 3 Encounters:  08/13/21 128/76  06/07/21 118/76  05/25/21 122/70    Physical Exam Vitals and nursing note reviewed.  Constitutional:      General: She is not in acute distress.    Appearance: Normal appearance. She is well-developed.  HENT:      Head: Normocephalic and atraumatic.  Cardiovascular:     Rate and Rhythm: Normal rate and regular rhythm.     Pulses: Normal pulses.  Pulmonary:     Effort: Pulmonary effort is normal. No respiratory distress.     Breath sounds: No wheezing or rhonchi.  Musculoskeletal:     Cervical back: Normal range of motion.     Right lower leg: No edema.     Left lower leg: No edema.  Lymphadenopathy:     Cervical: No cervical adenopathy.  Skin:    General: Skin is warm and dry.     Capillary Refill: Capillary refill takes less than 2 seconds.     Findings: No rash.  Neurological:     General: No focal deficit present.     Mental Status: She is alert and oriented to person, place, and time.  Psychiatric:        Mood and Affect: Mood normal.        Behavior: Behavior normal.    Wt Readings from Last 3 Encounters:  08/13/21 154 lb (69.9 kg)  06/07/21 154 lb (69.9 kg)  05/25/21 155 lb (70.3 kg)    BP 128/76   Pulse (!) 111   Temp 98.1 F (36.7 C) (Oral)   Ht '5\' 3"'  (1.6 m)   Wt 154 lb (69.9 kg)   LMP 07/20/2021   SpO2 98%   BMI 27.28 kg/m   Assessment and Plan: 1. Generalized anxiety disorder Doing well on Trintellix 10 mg - will continue Will give #90 with 1 RF as she is moving - vortioxetine HBr (TRINTELLIX) 10 MG TABS tablet; Take 1 tablet (10 mg total) by mouth daily.  Dispense: 90 tablet; Refill: 1  2. Elevated liver function tests Recommend repeat testing in 4-6 months to document stability   Partially dictated using Editor, commissioning. Any errors are unintentional.  Halina Maidens, MD [facility]  08/13/2021

## 2021-09-08 ENCOUNTER — Encounter: Payer: Self-pay | Admitting: Family Medicine

## 2022-05-27 ENCOUNTER — Encounter: Payer: BC Managed Care – PPO | Admitting: Internal Medicine

## 2022-10-18 IMAGING — US US ABDOMEN COMPLETE
1 series · 14 of 25 positions shown · non-contrast
Comparison: None.

CLINICAL DATA: Upper abdominal pain

EXAM:
ABDOMEN ULTRASOUND COMPLETE

[Series 1: us abdomen complete · 14 of 113 slices shown]
[im 1/113]
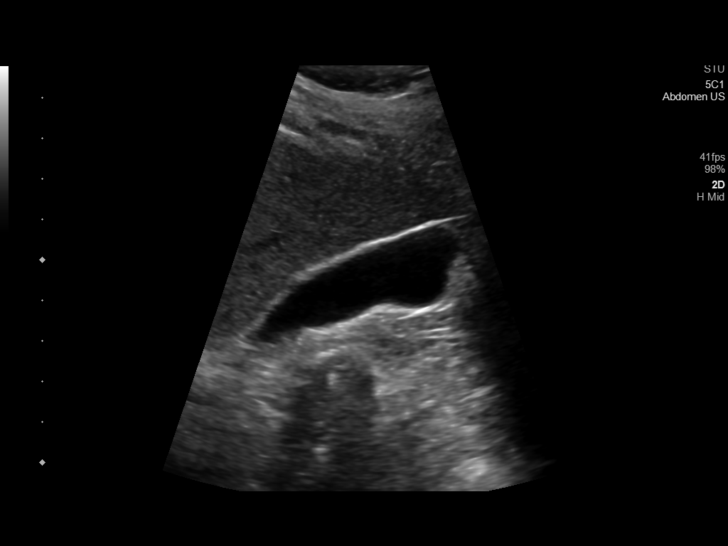
[im 10/113]
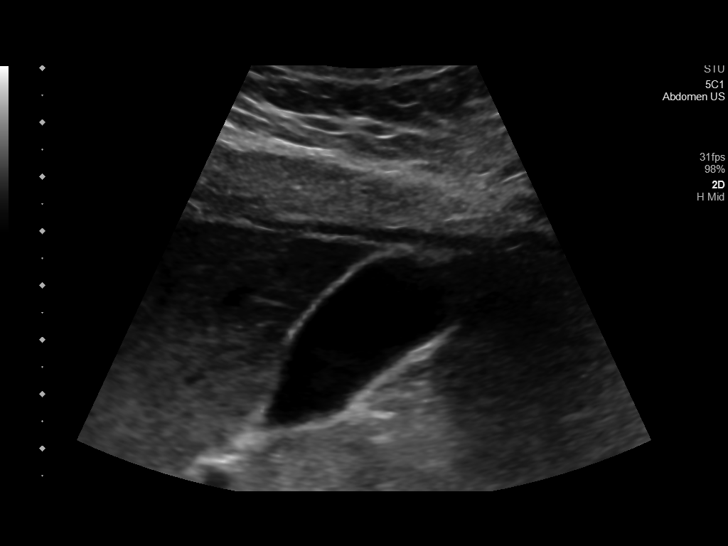
[im 19/113]
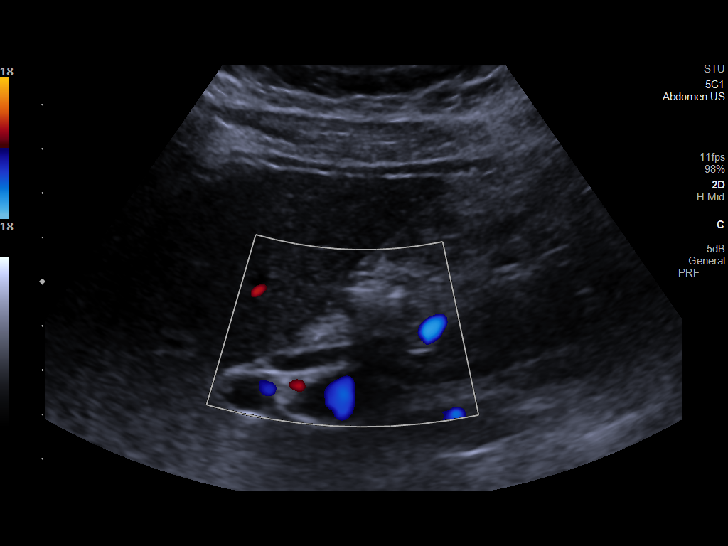
[im 29/113]
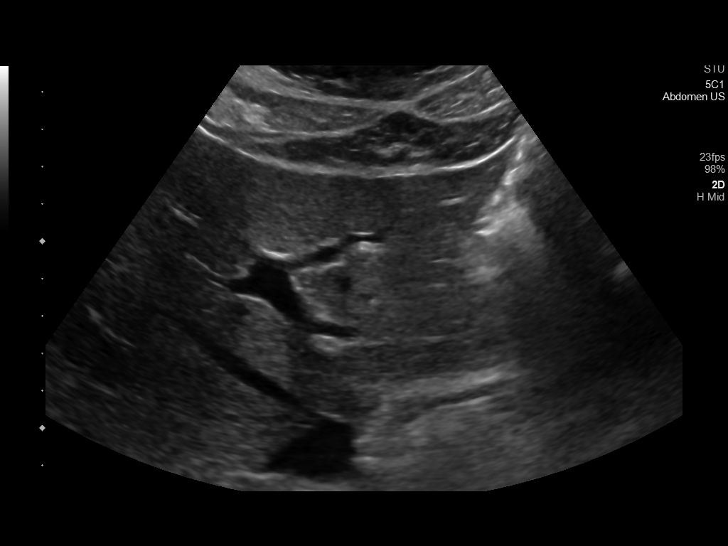
[im 38/113]
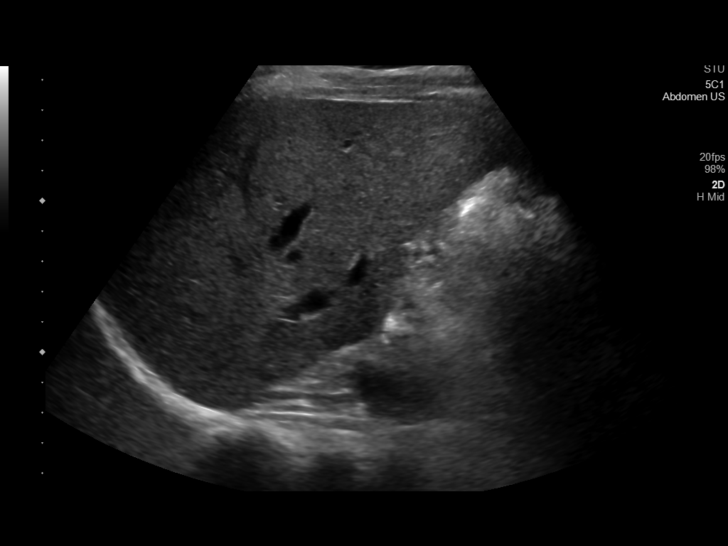
[im 43/113]
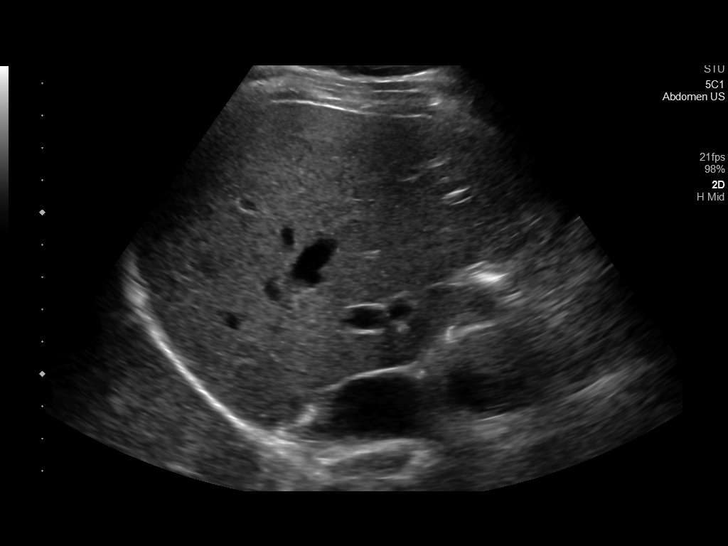
[im 52/113]
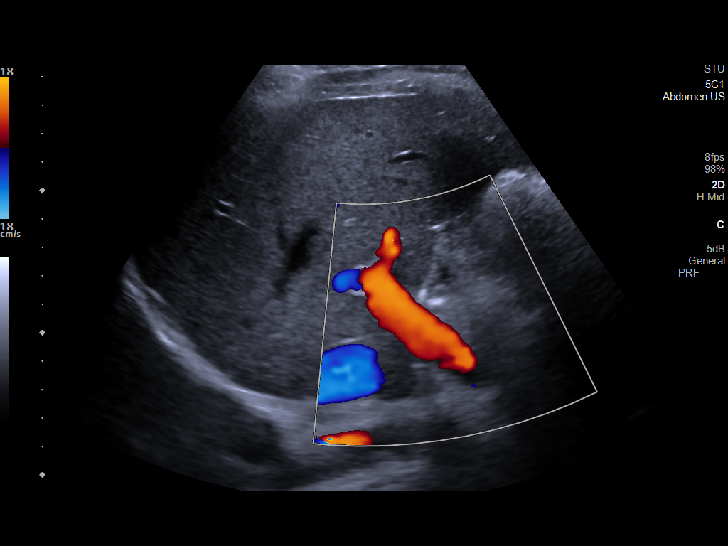
[im 61/113]
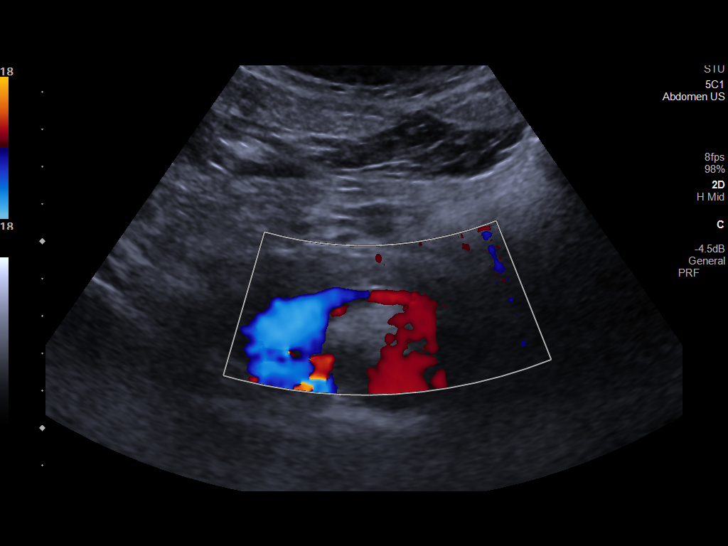
[im 71/113]
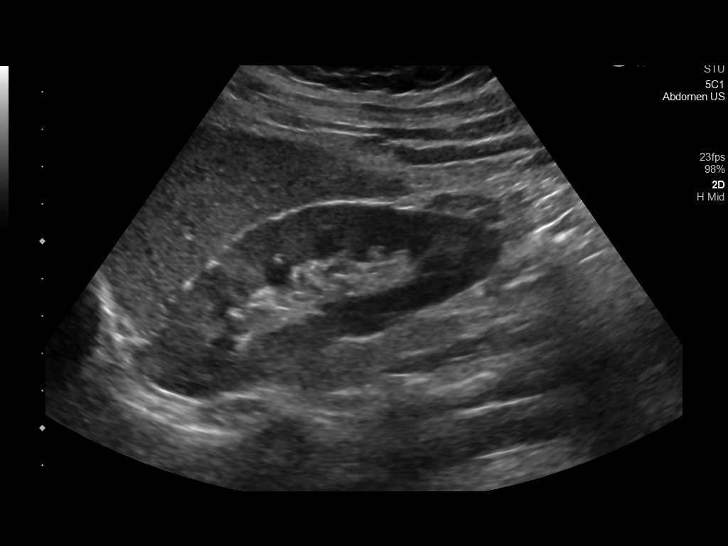
[im 75/113]
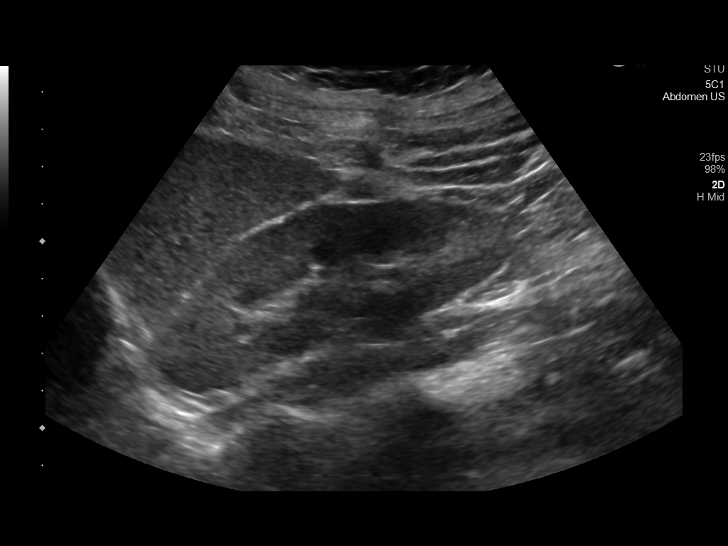
[im 85/113]
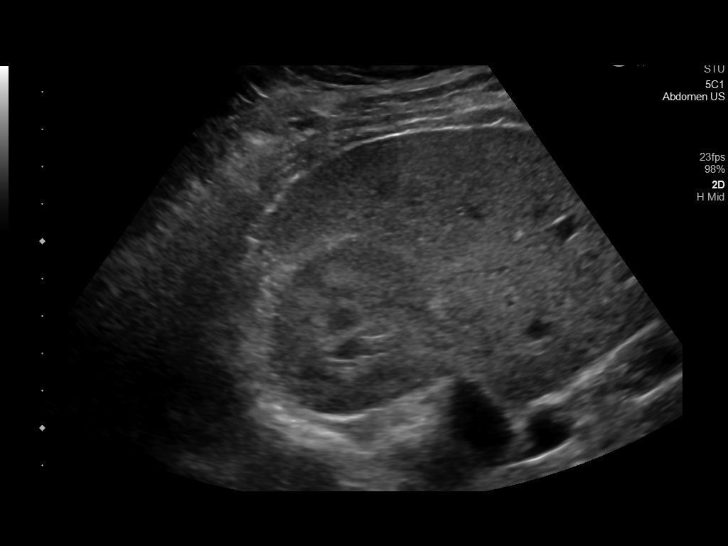
[im 94/113]
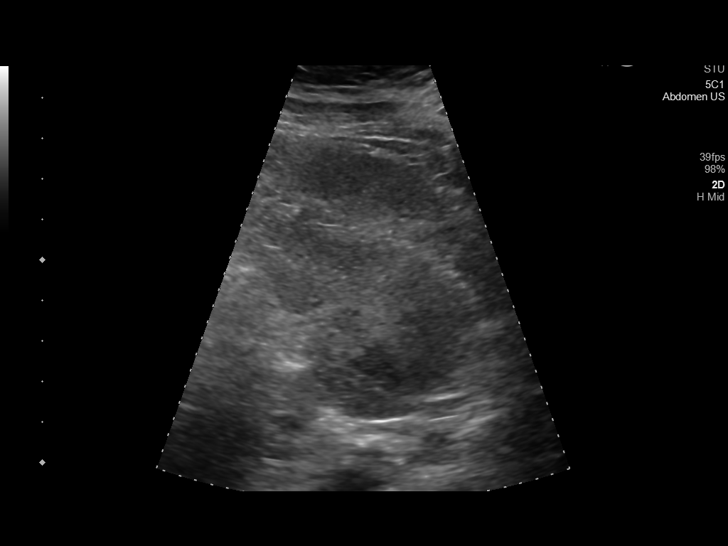
[im 103/113]
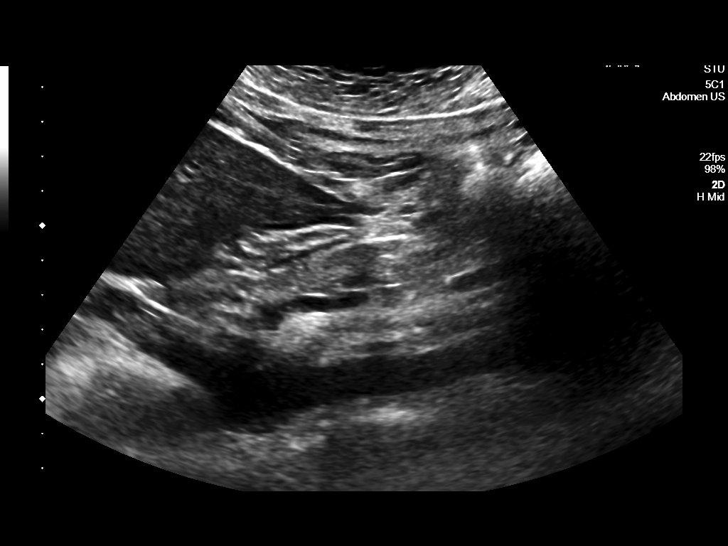
[im 113/113]
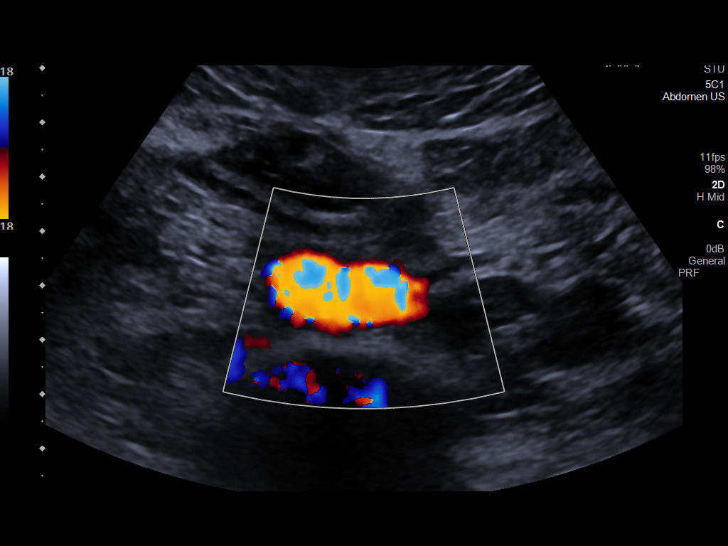

[14 of 25 positions shown; findings below may reference images not displayed]

FINDINGS: Gallbladder: No gallstones or wall thickening visualized. There is
no pericholecystic fluid. No sonographic Murphy sign noted by
sonographer.

Common bile duct: Diameter: 4 mm. No intrahepatic or extrahepatic
biliary duct dilatation.

Liver: No focal lesion identified. Within normal limits in
parenchymal echogenicity. Portal vein is patent on color Doppler
imaging with normal direction of blood flow towards the liver.

IVC: No abnormality visualized.

Pancreas: No appreciable pancreatic mass or inflammatory focus.

Spleen: Size and appearance within normal limits.

Right Kidney: Length: 10.5 cm. Echogenicity within normal limits. No
mass or hydronephrosis visualized.

Left Kidney: Length: 9.9 cm. Echogenicity within normal limits. No
mass or hydronephrosis visualized.

Abdominal aorta: No aneurysm visualized.

Other findings: No evident ascites.
IMPRESSION: Study within normal limits.
